# Patient Record
Sex: Female | Born: 1940 | Hispanic: No | Marital: Married | Smoking: Never smoker
Health system: Southern US, Community
[De-identification: ages and names within clinical notes are randomized; demographics above are authoritative.]

## PROBLEM LIST (undated history)

## (undated) DIAGNOSIS — I447 Left bundle-branch block, unspecified: Secondary | ICD-10-CM

## (undated) DIAGNOSIS — I10 Essential (primary) hypertension: Secondary | ICD-10-CM

## (undated) DIAGNOSIS — B019 Varicella without complication: Secondary | ICD-10-CM

## (undated) DIAGNOSIS — E785 Hyperlipidemia, unspecified: Secondary | ICD-10-CM

## (undated) HISTORY — DX: Hyperlipidemia, unspecified: E78.5

## (undated) HISTORY — PX: ABDOMINAL HYSTERECTOMY: SHX81

## (undated) HISTORY — DX: Essential (primary) hypertension: I10

## (undated) HISTORY — DX: Left bundle-branch block, unspecified: I44.7

## (undated) HISTORY — PX: EYE SURGERY: SHX253

## (undated) HISTORY — PX: APPENDECTOMY: SHX54

## (undated) HISTORY — DX: Varicella without complication: B01.9

---

## 1998-03-28 ENCOUNTER — Emergency Department (HOSPITAL_COMMUNITY): Admission: EM | Admit: 1998-03-28 | Discharge: 1998-03-28 | Payer: Self-pay | Admitting: Emergency Medicine

## 2000-01-13 ENCOUNTER — Emergency Department (HOSPITAL_COMMUNITY): Admission: EM | Admit: 2000-01-13 | Discharge: 2000-01-13 | Payer: Self-pay | Admitting: Emergency Medicine

## 2000-06-10 ENCOUNTER — Emergency Department (HOSPITAL_COMMUNITY): Admission: EM | Admit: 2000-06-10 | Discharge: 2000-06-10 | Payer: Self-pay | Admitting: Emergency Medicine

## 2007-05-31 ENCOUNTER — Ambulatory Visit: Payer: Self-pay | Admitting: Internal Medicine

## 2007-05-31 DIAGNOSIS — D485 Neoplasm of uncertain behavior of skin: Secondary | ICD-10-CM

## 2007-05-31 DIAGNOSIS — H409 Unspecified glaucoma: Secondary | ICD-10-CM

## 2007-05-31 DIAGNOSIS — I10 Essential (primary) hypertension: Secondary | ICD-10-CM | POA: Insufficient documentation

## 2007-05-31 DIAGNOSIS — M25569 Pain in unspecified knee: Secondary | ICD-10-CM

## 2007-06-02 ENCOUNTER — Encounter: Payer: Self-pay | Admitting: Internal Medicine

## 2007-06-22 ENCOUNTER — Ambulatory Visit: Payer: Self-pay | Admitting: Internal Medicine

## 2007-06-22 LAB — CONVERTED CEMR LAB
BUN: 12 mg/dL (ref 6–23)
Basophils Relative: 0.2 % (ref 0.0–1.0)
CO2: 31 meq/L (ref 19–32)
Cholesterol: 196 mg/dL (ref 0–200)
GFR calc Af Amer: 80 mL/min
Glucose, Bld: 84 mg/dL (ref 70–99)
HDL: 63.4 mg/dL (ref >39.0)
Hemoglobin: 12.8 g/dL (ref 12.0–15.0)
Lymphocytes Relative: 37.9 % (ref 12.0–46.0)
MCHC: 35.4 g/dL (ref 30.0–36.0)
Monocytes Absolute: 0.4 10*3/uL (ref 0.2–0.7)
Monocytes Relative: 8.7 % (ref 3.0–11.0)
Neutro Abs: 2.2 10*3/uL (ref 1.4–7.7)
Potassium: 3.9 meq/L (ref 3.5–5.1)
VLDL: 9 mg/dL (ref 0–40)

## 2007-06-25 LAB — CONVERTED CEMR LAB: Vit D, 1,25-Dihydroxy: 15 — ABNORMAL LOW (ref 20–57)

## 2007-06-28 ENCOUNTER — Ambulatory Visit: Payer: Self-pay | Admitting: Internal Medicine

## 2007-06-28 DIAGNOSIS — E785 Hyperlipidemia, unspecified: Secondary | ICD-10-CM

## 2007-06-28 DIAGNOSIS — E559 Vitamin D deficiency, unspecified: Secondary | ICD-10-CM | POA: Insufficient documentation

## 2007-06-29 ENCOUNTER — Ambulatory Visit: Payer: Self-pay | Admitting: Internal Medicine

## 2007-08-30 ENCOUNTER — Ambulatory Visit: Payer: Self-pay | Admitting: Internal Medicine

## 2007-11-30 ENCOUNTER — Ambulatory Visit: Payer: Self-pay | Admitting: Internal Medicine

## 2007-12-28 ENCOUNTER — Ambulatory Visit: Payer: Self-pay | Admitting: Internal Medicine

## 2007-12-29 LAB — CONVERTED CEMR LAB
CO2: 29 meq/L (ref 19–32)
Creatinine, Ser: 0.8 mg/dL (ref 0.4–1.2)
Glucose, Bld: 83 mg/dL (ref 70–99)
Potassium: 4.4 meq/L (ref 3.5–5.1)
Sodium: 139 meq/L (ref 135–145)

## 2008-01-25 ENCOUNTER — Ambulatory Visit: Payer: Self-pay | Admitting: Internal Medicine

## 2008-02-08 ENCOUNTER — Telehealth: Payer: Self-pay | Admitting: *Deleted

## 2008-05-17 ENCOUNTER — Ambulatory Visit: Payer: Self-pay | Admitting: Internal Medicine

## 2008-05-24 ENCOUNTER — Ambulatory Visit: Payer: Self-pay | Admitting: Internal Medicine

## 2008-05-30 ENCOUNTER — Telehealth: Payer: Self-pay | Admitting: *Deleted

## 2008-09-25 ENCOUNTER — Ambulatory Visit: Payer: Self-pay | Admitting: Internal Medicine

## 2008-10-03 ENCOUNTER — Ambulatory Visit: Payer: Self-pay | Admitting: Internal Medicine

## 2008-10-11 ENCOUNTER — Telehealth: Payer: Self-pay | Admitting: *Deleted

## 2008-10-20 ENCOUNTER — Telehealth: Payer: Self-pay | Admitting: Internal Medicine

## 2008-11-17 ENCOUNTER — Ambulatory Visit: Payer: Self-pay | Admitting: Internal Medicine

## 2009-06-06 ENCOUNTER — Telehealth: Payer: Self-pay | Admitting: *Deleted

## 2009-08-03 ENCOUNTER — Telehealth (INDEPENDENT_AMBULATORY_CARE_PROVIDER_SITE_OTHER): Payer: Self-pay | Admitting: *Deleted

## 2009-08-28 ENCOUNTER — Ambulatory Visit: Payer: Self-pay | Admitting: Internal Medicine

## 2009-08-28 ENCOUNTER — Telehealth: Payer: Self-pay | Admitting: *Deleted

## 2009-09-03 ENCOUNTER — Telehealth: Payer: Self-pay | Admitting: Internal Medicine

## 2010-07-16 ENCOUNTER — Telehealth: Payer: Self-pay | Admitting: Internal Medicine

## 2010-07-16 ENCOUNTER — Ambulatory Visit: Payer: Self-pay | Admitting: Internal Medicine

## 2010-07-16 DIAGNOSIS — Z9119 Patient's noncompliance with other medical treatment and regimen: Secondary | ICD-10-CM

## 2010-07-16 DIAGNOSIS — R0989 Other specified symptoms and signs involving the circulatory and respiratory systems: Secondary | ICD-10-CM

## 2010-07-16 DIAGNOSIS — I10 Essential (primary) hypertension: Secondary | ICD-10-CM

## 2010-07-16 DIAGNOSIS — I447 Left bundle-branch block, unspecified: Secondary | ICD-10-CM

## 2010-07-18 LAB — CONVERTED CEMR LAB
BUN: 17 mg/dL (ref 6–23)
Bilirubin, Direct: 0.1 mg/dL (ref 0.0–0.3)
CO2: 32 meq/L (ref 19–32)
Chloride: 104 meq/L (ref 96–112)
Cholesterol: 253 mg/dL — ABNORMAL HIGH (ref 0–200)
Creatinine, Ser: 0.9 mg/dL (ref 0.4–1.2)
Eosinophils Absolute: 0.1 10*3/uL (ref 0.0–0.7)
Glucose, Bld: 75 mg/dL (ref 70–99)
Lymphs Abs: 2.6 10*3/uL (ref 0.7–4.0)
MCHC: 33.5 g/dL (ref 30.0–36.0)
MCV: 87.4 fL (ref 78.0–100.0)
Monocytes Absolute: 0.4 10*3/uL (ref 0.1–1.0)
Neutrophils Relative %: 44.2 % (ref 43.0–77.0)
Platelets: 223 10*3/uL (ref 150.0–400.0)
TSH: 3.05 microintl units/mL (ref 0.35–5.50)
Total Bilirubin: 0.7 mg/dL (ref 0.3–1.2)
Total CHOL/HDL Ratio: 3
Total Protein: 7.1 g/dL (ref 6.0–8.3)
Triglycerides: 41 mg/dL (ref 0.0–149.0)
VLDL: 8.2 mg/dL (ref 0.0–40.0)

## 2010-08-05 ENCOUNTER — Ambulatory Visit: Payer: Self-pay | Admitting: Internal Medicine

## 2010-08-09 ENCOUNTER — Encounter: Payer: Self-pay | Admitting: Internal Medicine

## 2010-08-12 ENCOUNTER — Ambulatory Visit: Payer: Self-pay

## 2010-08-12 ENCOUNTER — Ambulatory Visit (HOSPITAL_COMMUNITY): Admission: RE | Admit: 2010-08-12 | Discharge: 2010-08-12 | Payer: Self-pay | Admitting: Internal Medicine

## 2010-08-12 ENCOUNTER — Encounter: Payer: Self-pay | Admitting: Internal Medicine

## 2010-08-12 ENCOUNTER — Ambulatory Visit: Payer: Self-pay | Admitting: Internal Medicine

## 2010-09-03 ENCOUNTER — Ambulatory Visit: Payer: Self-pay | Admitting: Internal Medicine

## 2010-09-06 ENCOUNTER — Ambulatory Visit: Payer: Self-pay | Admitting: Cardiology

## 2010-12-03 NOTE — Progress Notes (Signed)
Summary: Elevated BP  Phone Note From Other Clinic   Caller: Dr. [facility] [employer] Summary of Call: Pt will not be treated today at our office due to repeat elevated BP readings( 208/120 and 191/109).  Pt admits to not taking medication.  Would like to scheduled appt with Dr. Fabian Sharp asap.  Appt scheduled for today at 2:15pm Initial call taken by: Trixie Dredge,  July 16, 2010 11:03 AM  Follow-up for Phone Call        I tried to call pt to see if she could come in soonier and to get more info to see if there is any other problems but number has been disconnected. Pt was last seen in 2009. Follow-up by: Romualdo Bolk, CMA (AAMA),  July 16, 2010 11:17 AM

## 2010-12-03 NOTE — Assessment & Plan Note (Signed)
Summary: 2 WEEK FUP//CCM   Vital Signs:  Patient profile:   70 year old female Menstrual status:  hysterectomy Weight:      132 pounds Pulse rate:   78 / minute BP sitting:   182 / 90  (left arm) Cuff size:   regular  Vitals Entered By: Romualdo Bolk, CMA (AAMA) (August 05, 2010 1:49 PM)  Serial Vital Signs/Assessments:  Time      Position  BP       Pulse  Resp  Temp     By 1:53 PM             180/94                         Romualdo Bolk, CMA (AAMA)           R Arm     170/90                         Madelin Headings MD           L Arm     160/80                         Madelin Headings MD  Comments: 1:53 PM Pt's machine By: Romualdo Bolk, CMA (AAMA)  reg cuff sitting By: Madelin Headings MD    History of Present Illness: Kristina Davidson comes in today  for follow up as instructed for.fu of her  high BP readings . Since last visit she has taken her reading and brings in the machine and readings to review.  She has begun the benicar hctx but only taking 1/2 . her readings from home  are in the 120 130 and some in the 100 range   pulses 70 range . no tachycardia.  no readings recorded over 142 .    No other change in her health.  Denies se of med    Add hx   had positive stool hem in the past and went ot a prelin appt and said that didn have a good interaction so didnt follow though.  no blood in stool and no  concerns there.   Hypertension History:      She denies headache, chest pain, palpitations, dyspnea with exertion, orthopnea, PND, peripheral edema, visual symptoms, neurologic problems, syncope, and side effects from treatment.  She notes no problems with any antihypertensive medication side effects.        Positive major cardiovascular risk factors include female age 27 years old or older, hyperlipidemia, and hypertension.  Negative major cardiovascular risk factors include non-tobacco-user status.     Preventive Screening-Counseling &  Management  Alcohol-Tobacco     Alcohol drinks/day: 0     Smoking Status: never  Caffeine-Diet-Exercise     Caffeine use/day: 0     Does Patient Exercise: no     Type of exercise: aerobic     Times/week: 7  Current Medications (verified): 1)  Fish Oil 1000 Mg  Caps (Omega-3 Fatty Acids) 2)  Travatan Z 0.004 %  Soln (Travoprost) 3)  Systane 0.4-0.3 %  Soln (Polyethyl Glycol-Propyl Glycol) 4)  Vitamin D 1000 Unit Tabs (Cholecalciferol) 5)  Magnesium 300 Mg Caps (Magnesium) 6)  Benicar Hct 40-25 Mg Tabs (Olmesartan Medoxomil-Hctz) .Marland Kitchen.. 1 By Mouth Once Daily For High Blood Pressure  Allergies (verified): 1)  ! Cosopt (Dorzolamide  Hcl-Timolol Mal) 2)  ! Alphagan P (Brimonidine Tartrate) 3)  ! Sudafed 4)  ! Lisinopril (Lisinopril)  Past History:  Past medical, surgical, family and social histories (including risk factors) reviewed for relevance to current acute and chronic problems.  Past Medical History: Reviewed history from 07/16/2010 and no changes required. 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 Cataract extraction Hysterectomy 1975  Past History:  Care Management: Primary Care Md- In [place] of Name [facility]  Family History: Reviewed history from 07/16/2010 and no changes required. Family History of CAD Female 1st degree relative <50  bro father Family History Hypertension Family History Kidney disease See data base  Grand daughter has charge syndrome.  and has had   Social History: Reviewed history from 07/16/2010 and no changes required. Retired [employer] 3  Married Never Smoked Alcohol use-no Regular exercise-no  see data base grandchild with special needs.     Review of Systems  The patient denies anorexia, fever, chest pain, syncope, dyspnea on exertion, abdominal pain, melena, and hematochezia.          hx of leg pain.   not now   had pop in the summer now better.   to have eye surgery [facility] in the fall.   Physical Exam  General:  Well-developed,well-nourished,in no acute distress; alert,appropriate and cooperative throughout examination Head:  normocephalic.   Lungs:  Normal respiratory effort, chest expands symmetrically. Lungs are clear to auscultation, no crackles or wheezes. Heart:  Normal rate and regular rhythm. S1 and S2 normal without gallop, murmur, click, rub or other extra sounds. see readings  Pulses:  pulses intact without delay   Extremities:  no clubbing cyanosis or edema  Neurologic:  alert & oriented X3, strength normal in all extremities, and gait normal.   Skin:  turgor normal, color normal, no ecchymoses, and no petechiae.   Cervical Nodes:  No lymphadenopathy noted Psych:  Oriented X3, normally interactive, good eye contact, not depressed appearing, and slightly anxious.   see labs  reviewed   Impression & Recommendations:  Problem # 1:  HYPERTENSION, SEVERE (ICD-401.0)  readings at home are very good    and readings with her machine her are  very up in office but decreases  with time   . Definietly has a white coat component   continue 1/2 pill for now as if accurate concern about  low bp at home.    Looks well and has no   cv signs  but of course her ekg is abnormal ( see lst visit) Her updated medication list for this problem includes:    Benicar Hct 40-25 Mg Tabs (Olmesartan medoxomil-hctz) .Marland Kitchen... 1 by mouth once daily for high blood pressure  BP today: 182/90 Prior BP: 198/100 (07/16/2010)  Prior 10 Yr Risk Heart Disease: 11 % (07/16/2010)  Labs Reviewed: K+: 3.7 (07/16/2010) Creat: : 0.9 (07/16/2010)   Chol: 253 (07/16/2010)   HDL: 85.60 (07/16/2010)   LDL: 123 (06/22/2007)   TG: 41.0 (07/16/2010)  Problem # 2:  CAROTID BRUIT, RIGHT (ICD-785.9) pending doppler   Problem # 3:  LEFT BUNDLE BRANCH BLOCK (ICD-426.3) echo pending   then will plan   follow up and  cards eval.    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   Complete Medication List: 1)  Fish Oil 1000 Mg Caps (Omega-3 fatty acids) 2)  Travatan Z 0.004 % Soln (Travoprost) 3)  Systane 0.4-0.3 % Soln (Polyethyl glycol-propyl glycol) 4)  Vitamin D 1000 Unit Tabs (Cholecalciferol) 5)  Magnesium 300 Mg Caps (Magnesium) 6)  Benicar Hct 40-25 Mg Tabs (Olmesartan medoxomil-hctz) .Marland Kitchen.. 1 by mouth once daily for high blood pressure  Other Orders: Admin 1st Vaccine (09811) Flu Vaccine 56yrs + (91478)  Hypertension Assessment/Plan:      The patient's hypertensive risk group is category B: At least one risk factor (excluding diabetes) with no target organ damage.  Her calculated 10 year risk of coronary heart disease is 11 %.  Today's blood pressure is 182/90.  Her blood pressure goal is < 140/90.  Patient Instructions: 1)  continue  1/2 dose  of the benicar hctz daily. 2)  continue to monitor your blood pressure readings as you are doing.  3)  return office visit in 3-4 weeks (call if readings are  very elevated .  4)  Keep appt  for the echo and neck carotid dopplers .   5)  we may do a cardiology referral after this to get  more evaluation of your EKG .           Flu Vaccine Consent Questions     Do you have a history of severe allergic reactions to this vaccine? no    Any prior history of allergic reactions to egg and/or gelatin? no    Do you have a sensitivity to the preservative Thimersol? no    Do you have a past history of Guillan-Barre Syndrome? no    Do you currently have an acute febrile illness? no    Have you ever had a severe reaction to latex? no    Vaccine information given and explained to patient? yes    Are you currently pregnant? no    Lot Number:[id number]   Exp Date:05/03/2011   Site  Given  Left Deltoid IMbflu Romualdo Bolk, CMA (AAMA)  August 05, 2010 1:59 PM

## 2010-12-03 NOTE — Assessment & Plan Note (Signed)
Summary: np6/mild muscle stain on echo/lg  Medications Added FISH OIL 1000 MG  CAPS (OMEGA-3 FATTY ACIDS) 1 tab by mouth once daily TRAVATAN Z 0.004 %  SOLN (TRAVOPROST) as directed SYSTANE 0.4-0.3 %  SOLN (POLYETHYL GLYCOL-PROPYL GLYCOL) as directed VITAMIN D3 5000 UNIT CAPS (CHOLECALCIFEROL) 1 tab by mouth once daily MAGNESIUM 250 MG TABS (MAGNESIUM) 1 tab by mouth once daily        History of Present Illness: 70 year old female for evaluation of left bundle branch block and mild cardiomyopathy. Echocardiogram in October of 2011 showed an ejection fraction of 45% and mild left ventricular hypertrophy. There was trivial mitral regurgitation. Carotid Dopplers in October of 2011 showed a 40-59% left and 0-39% right stenosis. Followup recommended in 2 years. Note TSH, potassium, renal function and hemoglobin recently normal. Because of the above we were asked to further evaluate. The patient denies dyspnea on exertion, orthopnea, PND, pedal edema, palpitations, syncope or chest pain.  Current Medications (verified): 1)  Fish Oil 1000 Mg  Caps (Omega-3 Fatty Acids) .Marland Kitchen.. 1 Tab By Mouth Once Daily 2)  Travatan Z 0.004 %  Soln (Travoprost) .... As Directed 3)  Systane 0.4-0.3 %  Soln (Polyethyl Glycol-Propyl Glycol) .... As Directed 4)  Benicar Hct 20-12.5 Mg Tabs (Olmesartan Medoxomil-Hctz) .Marland Kitchen.. 1 By Mouth Once Daily  For Hypertension 5)  Vitamin D3 5000 Unit Caps (Cholecalciferol) .Marland Kitchen.. 1 Tab By Mouth Once Daily 6)  Magnesium 250 Mg Tabs (Magnesium) .Marland Kitchen.. 1 Tab By Mouth Once Daily  Allergies: 1)  ! Cosopt (Dorzolamide Hcl-Timolol Mal) 2)  ! Alphagan P (Brimonidine Tartrate) 3)  ! Sudafed 4)  ! Lisinopril (Lisinopril)  Past History:  Past Medical History: HYPERLIPIDEMIA LEFT BUNDLE BRANCH BLOCK CAROTID ARTERY DISEASE HYPERTENSION  Cardiomyopathy HEMOCCULT POSITIVE STOOL IFO DEFICIENCY, VITAMIN D NOS  Glaucoma  dec vision left eye childbirth x 3   g3 p3  Varicella  Past Surgical  History: Hysterectomy 1975 Appendectomy  Family History: Reviewed history from 07/16/2010 and no changes required. Family History of CAD Female 1st degree relative <50 father Family History Hypertension Family History Kidney disease  Social History: Reviewed history from 07/16/2010 and no changes required. Retired [employer] 3  Married Never Smoked Alcohol use-no Regular exercise-no  grandchild with special needs.     Review of Systems       Some back pain but no fevers or chills, productive cough, hemoptysis, dysphasia, odynophagia, melena, hematochezia, dysuria, hematuria, rash, seizure activity, orthopnea, PND, pedal edema, claudication. Remaining systems are negative.   Vital Signs:  Patient profile:   70 year old female Menstrual status:  hysterectomy Height:      62 inches Weight:      133 pounds BMI:     24.41 Pulse rate:   70 / minute Resp:     14 per minute BP sitting:   130 / 90  (left arm)  Vitals Entered By: Kem Parkinson (September 06, 2010 12:10 PM)  Physical Exam  General:  Well developed/well nourished in NAD Skin warm/dry Patient not depressed No peripheral clubbing Back-normal HEENT-normal/normal eyelids Neck supple/normal carotid upstroke bilaterally; bilateral bruits right greater than left; no JVD; no thyromegaly chest - CTA/ normal expansion CV - RRR/normal S1 and S2; no murmurs, rubs or gallops;  PMI nondisplaced Abdomen -NT/ND, no HSM, no mass, + bowel sounds, no bruit 2+ femoral pulses, no bruits Ext-no edema, chords, 2+ DP Neuro-grossly nonfocal     EKG  Procedure date:  07/16/2010  Findings:  Sinus rhythm at a rate of 74. Left bundle branch block.  Impression & Recommendations:  Problem # 1:  HYPERTENSION (ICD-401.9) Her blood pressure is mildly elevated today. However she checks this routinely at home and atypically is normal. Her systolic is less than 120 and her diastolic less than 80. Continue present medications.  Potassium and renal function monitored by primary care. Her updated medication list for this problem includes:    Benicar Hct 20-12.5 Mg Tabs (Olmesartan medoxomil-hctz) .Marland Kitchen... 1 by mouth once daily  for hypertension  Problem # 2:  HYPERLIPIDEMIA NEC/NOS (ICD-272.4) LDL is 159. She has documented cerebrovascular disease. I recommended a statin. However she is hesitant. She would like to discuss this with Dr. Fabian Sharp first.  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 I recommended a statin as described above. I also recommended enteric-coated aspirin 81 mg p.o. daily. However she apparently had a bleed in her eye previously. She would like to discuss this with her ophthalmologist prior to initiating. If no contraindication she should begin aspirin. Followup carotid Dopplers October 2013. I will leave this to primary care.  Problem # 4:  LEFT BUNDLE BRANCH BLOCK (ICD-426.3)  Problem # 5:  LEFT VENTRICULAR FUNCTION, DECREASED ECHO 2011 (ICD-429.2) Mildly reduced LV function on echocardiogram. Note TSH normal. I recommended a Lexiscan Myoview to exclude coronary disease. The patient is concerned about the cost and is not agreeable at present. She will consider this and contact us if she wishes to proceed. Hypertension may be the cause of her mildly reduced LV function. Continue present medications.

## 2010-12-03 NOTE — Assessment & Plan Note (Signed)
Summary: ELEVATED BP READINGS PER DR. DOWNEY//SLM   Vital Signs:  Patient profile:   70 year old female Height:      62.5 inches Weight:      132 pounds BMI:     23.84 Pulse rate:   60 / minute BP sitting:   198 / 100  (right arm) Cuff size:   regular  Vitals Entered By: Romualdo Bolk, CMA (AAMA) (July 16, 2010 12:56 PM)  Serial Vital Signs/Assessments:  Time      Position  BP       Pulse  Resp  Temp     By 2:28 PM             190/90                         Romualdo Bolk, CMA (AAMA) 3:03 PM             150/90                         Madelin Headings MD  Comments: 2:28 PM Left arm By: Romualdo Bolk, CMA (AAMA)  3:03 PM left arm sitting By: Madelin Headings MD   CC: BP elevated at the dentist BP was 208/120 then 191/109, pt is having slight chest pains coming over here.- Last seen by a primary care md over 1 year ago- in [place], [place]- Pt states that she was put on lisinopril but she had a cough with it. She has been off this for over 1 year. , Hypertension Management   History of Present Illness: Kristina Davidson comes in today as anemergency work in and for above. Sent  by dental school with BP elevations  She has not been in this office  for 2 years   cancelled her appts with [place] and ended up stopping her blood pressure medications. Gi  had appt f for colonscopy a nd eval for positive stools   test for blood.   Now dental school calling  for her to be seen. today  answer blood pressure there was 208 of her hundred and 28 and 191 over hundred and nine she will not be allowed to come back for treatment until her blood pressure is  below160/100  She apparently not taking any meds for Bp   . Lisinopril  caused a severe cough    no swelling in legs   .    no sob or sig c p .     has had some HA new changing her vision is under treatment for glaucoma.  Denies any weakness or numbness.   Hypertension History:      She complains of headache and chest pain, but denies  palpitations, dyspnea with exertion, orthopnea, PND, peripheral edema, visual symptoms, neurologic problems, syncope, and side effects from treatment.  She notes no problems with any antihypertensive medication side effects.  Slight ha's and Slight Chest Pains.        Positive major cardiovascular risk factors include female age 17 years old or older, hyperlipidemia, and hypertension.  Negative major cardiovascular risk factors include non-tobacco-user status.     Preventive Screening-Counseling & Management  Alcohol-Tobacco     Alcohol drinks/day: 0     Smoking Status: never  Caffeine-Diet-Exercise     Caffeine use/day: 0     Does Patient Exercise: no  Hep-HIV-STD-Contraception     Dental Visit-last 6 months yes  Safety-Violence-Falls     Seat Belt Use: yes     Smoke Detectors: yes      Blood Transfusions:  yes.    Current Medications (verified): 1)  Fish Oil 1000 Mg  Caps (Omega-3 Fatty Acids) 2)  Travatan Z 0.004 %  Soln (Travoprost) 3)  Systane 0.4-0.3 %  Soln (Polyethyl Glycol-Propyl Glycol) 4)  Vitamin D 1000 Unit Tabs (Cholecalciferol) 5)  Magnesium 300 Mg Caps (Magnesium)  Allergies (verified): 1)  ! Cosopt (Dorzolamide Hcl-Timolol Mal) 2)  ! Alphagan P (Brimonidine Tartrate) 3)  ! Sudafed 4)  ! Lisinopril (Lisinopril)  Past History:  Past Medical History: Hypertension Glaucoma  dec vision left eye childbirth x 3   g3 p3  Blood transfusion Varicella  Past Surgical History: Cataract extraction Hysterectomy 1975  Past History:  Care Management: Primary Care Md- In [place]- Unsure of Name [facility]  Family History: Family History of CAD Female 1st degree relative <50  bro father Family History Hypertension Family History Kidney disease See data base  Grand daughter has charge syndrome.  and has had   Social History: Retired [employer] 3  Married Never Smoked Alcohol use-no Regular exercise-no  see data base grandchild with special needs.      Caffeine use/day:  0 Seat Belt Use:  yes Dental Care w/in 6 mos.:  yes Blood Transfusions:  yes  Review of Systems  The patient denies anorexia, fever, weight loss, weight gain, decreased hearing, hoarseness, syncope, dyspnea on exertion, prolonged cough, hemoptysis, abdominal pain, melena, hematochezia, severe indigestion/heartburn, hematuria, muscle weakness, transient blindness, difficulty walking, depression, unusual weight change, abnormal bleeding, enlarged lymph nodes, and angioedema.    Physical Exam  General:  Well-developed,well-nourished,in no acute distress; alert,appropriate and cooperative throughout examination mildly anxious Head:  normocephalic and atraumatic.   Eyes:  vision grossly intact.  some redness around no discharge  Ears:  R ear normal, L ear normal, and no external deformities.   Nose:  no external deformity, no external erythema, and no nasal discharge.   Mouth:  pharynx pink and moist.   Neck:  bruit  right   neck   no masses  supple  Lungs:  Normal respiratory effort, chest expands symmetrically. Lungs are clear to auscultation, no crackles or wheezes.no dullness.   Heart:  Normal rate and regular rhythm. S1 and S2 normal without gallop, murmur, click, rub or other extra sounds.no lifts.   Abdomen:  Bowel sounds positive,abdomen soft and non-tender without masses, organomegaly or hernias noted. no bruits  no masses  Msk:  no joint swelling and no joint warmth.   Pulses:  pulses intact without delay   Extremities:  trace left pedal edema and trace right pedal edema.   Neurologic:  alert & oriented X3, strength normal in all extremities, and gait normal.  nl  non focal  Skin:  turgor normal, color normal, no ecchymoses, and no petechiae.   Cervical Nodes:  No lymphadenopathy noted Psych:  Oriented X3, not depressed appearing, and slightly anxious.  quiet and   nnl cognition EKG  rate 74  LBBB none to compare with    Impression & Recommendations:  Problem  # 1:  HYPERTENSION, SEVERE (ICD-401.0) apparently untreated for  at least 6-8 months by hx .  patient says readings were good enough in Feb to see dentist and had 130 type reading in the last month...     patient was observed in the office after being given .1 mg clonidine. She tolerated it well  and after two hours was able to leave the office without significant symptoms. her discharge blood pressure was about 150. The following medications were removed from the medication list:    Maxzide-25 37.5-25 Mg Tabs (Triamterene-hctz) .Marland Kitchen... 1/2 - 1 by mouth once daily for high blood pressure Her updated medication list for this problem includes:    Benicar Hct 40-25 Mg Tabs (Olmesartan medoxomil-hctz) .Marland Kitchen... 1 by mouth once daily for high blood pressure  Orders: TLB-BMP (Basic Metabolic Panel-BMET) (80048-METABOL) TLB-CBC Platelet - w/Differential (85025-CBCD) TLB-Hepatic/Liver Function Pnl (80076-HEPATIC) TLB-TSH (Thyroid Stimulating Hormone) (84443-TSH) TLB-Lipid Panel (80061-LIPID) TLB-Sedimentation Rate (ESR) (85652-ESR) Clonidine 0.1mg  tab (EMRORAL) EKG w/ Interpretation (93000)  Problem # 2:  NON ADHERENCE  TO MEDICATION (ICD-V15.81) lack of medical care for this and failure to follow up  related to family  obligations and inability to get to help  care of  special needs grand child in [place].   Problem # 3:  LEFT BUNDLE BRANCH BLOCK (ICD-426.3) unclear how new this finding is .     no hx of cv neuro events  .  nwill need echo and  cards eval    eventually   Orders: Cardiology Referral (Cardiology) EKG w/ Interpretation (93000)  Problem # 4:  GLUCOMA (ICD-365.9) [facility] at [facility] .  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 She apparently  has no symptoms of blood in her stool.  Complete Medication List: 1)  Fish Oil 1000 Mg Caps (Omega-3 fatty acids) 2)  Travatan Z 0.004 % Soln (Travoprost) 3)  Systane 0.4-0.3 % Soln (Polyethyl glycol-propyl glycol) 4)  Vitamin D 1000 Unit Tabs (Cholecalciferol) 5)  Magnesium 300 Mg Caps (Magnesium) 6)  Benicar Hct 40-25 Mg Tabs (Olmesartan medoxomil-hctz) .Marland Kitchen.. 1 by mouth once daily for high blood pressure  Hypertension Assessment/Plan:      The patient's hypertensive risk group is category B: At least one risk factor (excluding diabetes) with no target organ damage.  Her calculated 10 year risk of coronary heart disease is 11 %.  Today's blood pressure is 198/100.  Her blood pressure goal is < 140/90.  Patient Instructions: 1)  take it easy  this week until Bp is down  to below 160 /100 2)  begin new BP med samples  cn take 1/2 of pill   each day  to begin if you wish. 3)  we gave you  .1 mg  of clonidine in the office .  4)  Rreturn office visit in 2 weeks    5)  bring your BP machine to ov with you. 6)  We should order an echo test of your heart because of the abnormal EKG and   doppler test of your neck arteries   then  further evaluation  pending these results.    Medication Administration  Medication # 1:    Medication: Clonidine 0.1mg  tab    Diagnosis: HYPERTENSION, SEVERE (ICD-401.0)    Dose: 1 tablet    Route: po    Exp Date: 02/02/2011    Lot #: [id number]    Mfr: [employer]    Comments: Gave at 2pm    Patient tolerated medication without complications    Given by: Romualdo Bolk, CMA Duncan Dull) (July 16, 2010 2:05  PM)  Orders Added: 1)  TLB-BMP (Basic Metabolic Panel-BMET) [80048-METABOL] 2)  TLB-CBC Platelet - w/Differential [85025-CBCD] 3)  TLB-Hepatic/Liver Function Pnl [80076-HEPATIC] 4)  TLB-TSH (Thyroid Stimulating Hormone) [84443-TSH] 5)  TLB-Lipid Panel [80061-LIPID] 6)  TLB-Sedimentation Rate (ESR) [85652-ESR] 7)  Clonidine 0.1mg  tab [EMRORAL] 8)   Cardiology Referral [Cardiology] 9)  Cardiology Referral [Cardiology] 10)  Est. Patient Level V [62130] 11)  EKG w/ Interpretation [93000]

## 2010-12-03 NOTE — Assessment & Plan Note (Signed)
Summary: 4 week fup//ccm   Vital Signs:  Patient profile:   70 year old female Menstrual status:  hysterectomy Weight:      133 pounds Pulse rate:   66 / minute BP sitting:   120 / 80  (left arm) Cuff size:   regular  Vitals Entered By: Romualdo Bolk, CMA (AAMA) (September 03, 2010 10:39 AM)  Serial Vital Signs/Assessments:  Time      Position  BP       Pulse  Resp  Temp     By                     140/80                         Madelin Headings MD  Comments: right arm sitting  By: Madelin Headings MD   CC: follow-up visit, Hypertension Management   History of Present Illness: Kristina Davidson comes in today  for follow up with her daughter related to elevated Bp abnormal EKG LBBB and caritid bruit. Since last visit  here  there have been no major changes in health status   feels well and no cp sob  cough or sig edema.  Has taken bp readingsa t home and has a log that shows all 120 range or less with pulse of 70-80.  Daughter  says she helped take the readings  Hypertension History:      She denies headache, chest pain, palpitations, dyspnea with exertion, orthopnea, PND, peripheral edema, visual symptoms, neurologic problems, syncope, and side effects from treatment.  She notes no problems with any antihypertensive medication side effects.        Positive major cardiovascular risk factors include female age 61 years old or older, hyperlipidemia, and hypertension.  Negative major cardiovascular risk factors include non-tobacco-user status.     Preventive Screening-Counseling & Management  Alcohol-Tobacco     Alcohol drinks/day: 0     Smoking Status: never  Caffeine-Diet-Exercise     Caffeine use/day: 0     Does Patient Exercise: no     Type of exercise: aerobic     Times/week: 7  Current Medications (verified): 1)  Fish Oil 1000 Mg  Caps (Omega-3 Fatty Acids) 2)  Travatan Z 0.004 %  Soln (Travoprost) 3)  Systane 0.4-0.3 %  Soln (Polyethyl Glycol-Propyl Glycol) 4)   Vitamin D 1000 Unit Tabs (Cholecalciferol) 5)  Magnesium 300 Mg Caps (Magnesium) 6)  Benicar Hct 40-25 Mg Tabs (Olmesartan Medoxomil-Hctz) .Marland Kitchen.. 1 By Mouth Once Daily For High Blood Pressure  Allergies (verified): 1)  ! Cosopt (Dorzolamide Hcl-Timolol Mal) 2)  ! Alphagan P (Brimonidine Tartrate) 3)  ! Sudafed 4)  ! Lisinopril (Lisinopril)  Past History:  Past medical, surgical, family and social histories (including risk factors) reviewed, and no changes noted (except as noted below).  Past Medical History: Reviewed history from 07/16/2010 and no changes required. 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 Cataract extraction Hysterectomy 1975  Past History:  Care Management: Primary Care Md- [place] [facility] Name [facility]  Family History: Reviewed history from 07/16/2010 and no changes required. Family History of CAD Female 1st degree relative <50  bro father Family History Hypertension Family History Kidney disease See data base  Grand daughter has charge syndrome.  and has had   Social History: Reviewed history from 07/16/2010 and no changes required. Retired [employer] 3  Married Never Smoked Alcohol use-no Regular exercise-no  see data base grandchild with special needs.     Review of Systems  The patient denies anorexia, fever, weight loss, weight gain, chest pain, syncope, dyspnea on exertion, prolonged cough, abdominal pain, difficulty walking, abnormal bleeding, enlarged lymph nodes, and angioedema.    Physical Exam  General:  Well-developed,well-nourished,in no acute distress; alert,appropriate and cooperative throughout examination Head:  normocephalic and atraumatic.   Heart:  normal rate and regular rhythm.   Psych:  Oriented X3, good eye contact, not anxious appearing, and not depressed appearing.   reviewed     BP readings  log  all 130 or below   and pulse 70 range   Reviewed  carotid dopplers and echo cardiogram.    Impression & Recommendations:  Problem # 1:  HYPERTENSION, SEVERE (ICD-401.0) Assessment Improved nl at home and white coat effect also   ok to stay on 20/12.5 of benicar hctz and keep appt.  eventually needs repeat Chem panel     to recheck potassium and cr on meds .  The following medications were removed from the medication list:    Benicar Hct 40-25 Mg Tabs (Olmesartan medoxomil-hctz) .Marland Kitchen... 1 by mouth once daily for high blood pressure Her updated medication list for this problem includes:    Benicar Hct 20-12.5 Mg Tabs (Olmesartan medoxomil-hctz) .Marland Kitchen... 1 by mouth once daily  for hypertension  Problem # 2:  LEFT BUNDLE BRANCH BLOCK (ICD-426.3) mild hypokineses on  echo  mild lvh   apparently no   symptoms .    Problem # 3:  CAROTID BRUIT, RIGHT (ICD-785.9) dopplers show moderate obst on left   follow  Problem # 4:  GLUCOMA (ICD-365.9) eye surgery pending  in December .  get cards eval in the meantime  Problem # 5:  LEFT VENTRICULAR FUNCTION, DECREASED ECHO 2011 (ICD-429.2)  The following medications were removed from the medication list:    Benicar Hct 40-25 Mg Tabs (Olmesartan medoxomil-hctz) .Marland Kitchen... 1 by mouth once daily for high blood pressure Her updated medication list for this problem includes:    Benicar Hct 20-12.5 Mg Tabs (Olmesartan medoxomil-hctz) .Marland Kitchen... 1 by mouth once daily  for hypertension  Complete Medication List: 1)  Fish Oil 1000 Mg Caps (Omega-3 fatty acids) 2)  Travatan Z 0.004 % Soln (Travoprost) 3)  Systane 0.4-0.3 % Soln (Polyethyl glycol-propyl glycol) 4)  Vitamin D 1000 Unit Tabs (Cholecalciferol) 5)  Magnesium 300 Mg Caps (Magnesium) 6)  Benicar Hct 20-12.5 Mg Tabs (Olmesartan medoxomil-hctz) .Marland Kitchen.. 1 by mouth once daily  for hypertension  Hypertension Assessment/Plan:      The patient's hypertensive risk group is category B: At least one risk  factor (excluding diabetes) with no target organ damage.  Her calculated 10 year risk of coronary heart disease is 7 %.  Today's blood pressure is 120/80.  Her blood pressure goal is < 140/90.  Patient Instructions: 1)  continue same Blood pressure medication and keep cardiology appt. 2)  Then plan follow up after cardiology evaluation   .  Prescriptions: BENICAR HCT 20-12.5 MG TABS (OLMESARTAN MEDOXOMIL-HCTZ) 1 by mouth once daily  for hypertension  #30 x 5   Entered and Authorized by:   Madelin Headings MD   Signed by:   Madelin Headings MD on 09/03/2010   Method used:   Electronically to  [facility] [address] [phone]* (retail)       [address] [place], [place]  [address]       Ph: [phone]       Fax: [phone]   RxID:   [id number]    Orders Added: 1)  Est. Patient Level IV [[address]]

## 2010-12-03 NOTE — Miscellaneous (Signed)
Summary: Orders Update  Clinical Lists Changes  Orders: Added new Test order of Carotid Duplex (Carotid Duplex) - Signed 

## 2011-06-02 ENCOUNTER — Encounter: Payer: Self-pay | Admitting: Internal Medicine

## 2011-06-02 ENCOUNTER — Other Ambulatory Visit: Payer: Self-pay | Admitting: Internal Medicine

## 2011-06-03 ENCOUNTER — Ambulatory Visit (INDEPENDENT_AMBULATORY_CARE_PROVIDER_SITE_OTHER): Payer: Medicare Other | Admitting: Internal Medicine

## 2011-06-03 ENCOUNTER — Encounter: Payer: Self-pay | Admitting: Internal Medicine

## 2011-06-03 VITALS — BP 120/90 | HR 72 | Wt 134.0 lb

## 2011-06-03 DIAGNOSIS — I10 Essential (primary) hypertension: Secondary | ICD-10-CM

## 2011-06-03 DIAGNOSIS — Z91199 Patient's noncompliance with other medical treatment and regimen due to unspecified reason: Secondary | ICD-10-CM

## 2011-06-03 DIAGNOSIS — R0989 Other specified symptoms and signs involving the circulatory and respiratory systems: Secondary | ICD-10-CM

## 2011-06-03 DIAGNOSIS — E785 Hyperlipidemia, unspecified: Secondary | ICD-10-CM

## 2011-06-03 DIAGNOSIS — I428 Other cardiomyopathies: Secondary | ICD-10-CM

## 2011-06-03 DIAGNOSIS — Z9119 Patient's noncompliance with other medical treatment and regimen: Secondary | ICD-10-CM

## 2011-06-03 DIAGNOSIS — I447 Left bundle-branch block, unspecified: Secondary | ICD-10-CM

## 2011-06-03 DIAGNOSIS — H409 Unspecified glaucoma: Secondary | ICD-10-CM

## 2011-06-03 MED ORDER — SIMVASTATIN 20 MG PO TABS: 20.0000 mg | ORAL_TABLET | Freq: Every day | ORAL | Status: DC

## 2011-06-03 NOTE — Progress Notes (Signed)
  Subjective:    Patient ID: Kristina Davidson, female    DOB: [birth year], 70 y.o.   MRN: [id number]  HPI Patient comes in for followup visit. Her last visit with [place] was in the fall of 2011. After she saw cardiology consult and she was supposed to come back but this didn't happen. Cardiology had recommended a statin medicine daily aspirin if it was okay with her eye doctor and good blood pressure control followup carotid Dopplers in 2013.  Since her last visit 9 months ago she did have eye surgery  December and late February . Cataract and replacement lens.    Vision is better  However recentlyhusband had stroke    causing stress and disruption    Daughter and husband and herself and sometime help some.   Blood pressure :  On meds  And not checking.  But seems to be ok no se of meds no recent labs  Review of Systems Neg cp sob edema cough numbness or bleeding   Past history family history social history reviewed in the electronic medical record.       Objective:   Physical Exam WDWN in nad  Neck suplle no masses  High pitched bruit right low faint on left  Chest:  Clear to A&P without wheezes rales or rhonchi CV:  S1-S2 no gallops or murmurs peripheral perfusion is normal  Repeat BP 145/90 right arm No clubbing cyanosis  1 + edema  neuro grossly  Intact  Anxious but nl affect and cognition and balance      Assessment & Plan:  Hypertension   ? Control thinks its good  My reading much higher than cma  Carotid artery disease  Right   Due for doppler in 2013 Dyslipidemia  Disc  use of statins rec by cards and reasoning . Cost  And se are  Issues    Counseled.  Hesitant to use meds but will try  Glaucoma LBBB with some mild cm  lack of timely follow up but doing ok. Family illness Total visit 40 mins > 50% spent counseling and coordinating care

## 2011-06-03 NOTE — Patient Instructions (Signed)
Check BP readings at home to make sure your Blood pressure is controlled.  Begin statin cholesterol medication as we discussed. Labs in 6-8 weeks  To include lipids and kidney function.  Then plan follow up after this is done.

## 2011-06-07 ENCOUNTER — Encounter: Payer: Self-pay | Admitting: Internal Medicine

## 2011-06-07 DIAGNOSIS — I429 Cardiomyopathy, unspecified: Secondary | ICD-10-CM | POA: Insufficient documentation

## 2011-07-09 ENCOUNTER — Other Ambulatory Visit: Payer: Self-pay | Admitting: Internal Medicine

## 2011-07-29 ENCOUNTER — Other Ambulatory Visit (INDEPENDENT_AMBULATORY_CARE_PROVIDER_SITE_OTHER): Payer: Medicare Other

## 2011-07-29 DIAGNOSIS — I10 Essential (primary) hypertension: Secondary | ICD-10-CM

## 2011-07-29 DIAGNOSIS — E785 Hyperlipidemia, unspecified: Secondary | ICD-10-CM

## 2011-07-29 LAB — LIPID PANEL
Cholesterol: 208 mg/dL — ABNORMAL HIGH (ref 0–200)
Total CHOL/HDL Ratio: 3
VLDL: 7.2 mg/dL (ref 0.0–40.0)

## 2011-07-29 LAB — BASIC METABOLIC PANEL
Chloride: 103 mEq/L (ref 96–112)
Creatinine, Ser: 1.2 mg/dL (ref 0.4–1.2)
Sodium: 140 mEq/L (ref 135–145)

## 2011-07-29 LAB — LDL CHOLESTEROL, DIRECT: Direct LDL: 116.3 mg/dL

## 2011-07-29 LAB — HEPATIC FUNCTION PANEL
ALT: 13 U/L (ref 0–35)
Alkaline Phosphatase: 37 U/L — ABNORMAL LOW (ref 39–117)
Bilirubin, Direct: 0.1 mg/dL (ref 0.0–0.3)
Total Bilirubin: 0.7 mg/dL (ref 0.3–1.2)

## 2011-08-25 ENCOUNTER — Encounter: Payer: Self-pay | Admitting: Internal Medicine

## 2011-08-25 ENCOUNTER — Ambulatory Visit (INDEPENDENT_AMBULATORY_CARE_PROVIDER_SITE_OTHER): Payer: Medicare Other | Admitting: Internal Medicine

## 2011-08-25 VITALS — BP 122/88 | HR 60 | Temp 98.4°F | Wt 130.0 lb

## 2011-08-25 DIAGNOSIS — Z23 Encounter for immunization: Secondary | ICD-10-CM

## 2011-08-25 DIAGNOSIS — I10 Essential (primary) hypertension: Secondary | ICD-10-CM

## 2011-08-25 DIAGNOSIS — E785 Hyperlipidemia, unspecified: Secondary | ICD-10-CM

## 2011-08-25 DIAGNOSIS — I428 Other cardiomyopathies: Secondary | ICD-10-CM

## 2011-08-25 DIAGNOSIS — H409 Unspecified glaucoma: Secondary | ICD-10-CM

## 2011-08-25 DIAGNOSIS — I779 Disorder of arteries and arterioles, unspecified: Secondary | ICD-10-CM

## 2011-08-25 DIAGNOSIS — I447 Left bundle-branch block, unspecified: Secondary | ICD-10-CM

## 2011-08-25 NOTE — Progress Notes (Signed)
Subjective:    Patient ID: Kristina Davidson, female    DOB: April 16, 1941, 70 y.o.   MRN: [id number]  HPI Patient comes in today for follow up of  multiple medical problems.   No major change in health status since last visit .   taking meds no se noted  bp goes up with stress but thinks it has been ok   CM lbbb no cp sob edema  Doe  LIPIDs  Not taking med causing worried  About se .    Carotid disease no strokes or tia  Sx  taking asa    Review of Systems ROS:  GEN/ HEENTNo fever, significant weight changes sweats headaches  hearing changes, CV/ PULM; No chest pain shortness of breath cough, syncope,edema  change in exercise tolerance. GI /GU: No adominal pain, vomiting, change in bowel habits. No blood in the stool. No significant GU symptoms. SKIN/HEME: ,no acute skin rashes suspicious lesions or bleeding. No lymphadenopathy, nodules, masses.  NEURO/ PSYCH:  No neurologic signs such as weakness numbness . IMM/ Allergy: No unusual infections.  Marland Kitchen   REST of 12 system review negative or as per hpii  Past Medical History  Diagnosis Date  . Hyperlipidemia   . Left bundle branch block   . Hypertension   . Cardiomyopathy   . Vitamin D deficiency   . Glaucoma     dev vision left eye  . Varicella    Past Surgical History  Procedure Date  . Abdominal hysterectomy   . Appendectomy   . Eye surgery     glaucoma.cataract    reports that she has never smoked. She does not have any smokeless tobacco history on file. She reports that she does not drink alcohol or use illicit drugs. family history includes Coronary artery disease in her other; Heart disease in her father; Hypertension in her other; and Kidney disease in her other. Allergies  Allergen Reactions  . Brimonidine Tartrate     REACTION: eyes red and swollen  . Cosopt     REACTION: eyes red and swollen  . Lisinopril     REACTION: cough  . Pseudoephedrine     REACTION: heart races        Objective:   Physical  Exam Physical Exam: Vital signs reviewed WUJ:WJXB is a well-developed well-nourished alert cooperative  white female who appears her stated age in no acute distress.  HEENT: normocephalic a traumatic , Eyes: EOM's full, conjunctiva clear, Nares: paten,t no deformity discharge or tenderness., Ears: no deformity EAC's clear TMs with normal landmarks. Mouth: clear OP, no lesions, edema.  Moist mucous membranes. Dentition in adequate repair. NECK: supple without masses, thyromegaly   Bilateral carotid bruits  CHEST/PULM:  Clear to auscultation and percussion breath sounds equal no wheeze , rales or rhonchi. No chest wall deformities or tenderness. CV: PMI is nondisplaced, S1 S2 no gallops, murmurs, rubs. Peripheral pulses are full without delay.No JVD .  ABDOMEN: Bowel sounds normal nontender  No guard or rebound, no hepato splenomegal no CVA tenderness.  No hernia. Extremtities:  No clubbing cyanosis or edema, no acute joint swelling or redness no focal atrophy NEURO:  Oriented x3, cranial nerves 3-12 appear to be intact, no obvious focal weakness,gait within normal limits no abnormal reflexes or asymmetrical SKIN: No acute rashes normal turgor, color, no bruising or petechiae. PSYCH: Oriented, good eye contact, no obvious depression anxiety, cognition and judgment appear normal. Oriented x 3 and no noted deficits in memory, attention, and speech.  ehr record review     Assessment & Plan:  hypertension   White coat effect noted but apparently doing ok LBBB Hx of mild decrease in lv function  prob from ht LIPIDS; fear of statin meds  carotid disease asymptomatic  Has bilateral bruits   Exam and doppler Family stress feels doing well

## 2011-08-25 NOTE — Patient Instructions (Signed)
Need to follow up with cardiology .  About follow up of carotid disease.  Repeat dopplers . Continue on same  Medication for blood pressure control.  It is good today. Your cholesterol is better   But keep working on this to get it even lower.  Would discuss with cardiology about  Hesitancy of taking the statin medications.

## 2011-08-30 NOTE — Assessment & Plan Note (Signed)
No sx 

## 2011-10-06 ENCOUNTER — Telehealth: Payer: Self-pay | Admitting: Internal Medicine

## 2011-10-06 NOTE — Telephone Encounter (Signed)
Open in error

## 2011-11-17 ENCOUNTER — Telehealth: Payer: Self-pay | Admitting: Internal Medicine

## 2011-11-17 NOTE — Telephone Encounter (Signed)
Pt called and said that her 73 year old daugther is req to est with Dr Fabian Sharp, but she is need to an ov asap with Dr Fabian Sharp for cpx with labs. Req to get this sch during January. Pt is aware that Dr Fabian Sharp is out of the office this wk.

## 2011-11-20 ENCOUNTER — Other Ambulatory Visit: Payer: Self-pay | Admitting: Internal Medicine

## 2011-11-24 NOTE — Telephone Encounter (Signed)
Pretty booked for rest of January but could work her in Feb 5th  Tues 11 15 abd 11 30 slots  For new patient  Slot.  We can do labs at that visit even if not fasting if  needed.

## 2011-12-01 NOTE — Telephone Encounter (Signed)
Lft vm last wk and today for pts mom to cb and sch her daughter a np ov cpx as noted. Waiting on call back.

## 2011-12-02 ENCOUNTER — Telehealth: Payer: Self-pay | Admitting: Internal Medicine

## 2011-12-02 NOTE — Telephone Encounter (Signed)
Called pts mom about est her daughter with you on 12/09/11 as noted in previous message. Her daughter can not come in that day and would like to sch np cpx at end of February. Pls advise.

## 2011-12-04 NOTE — Telephone Encounter (Signed)
This is not the pt. There is a staff message about daughter.

## 2012-01-01 ENCOUNTER — Ambulatory Visit (INDEPENDENT_AMBULATORY_CARE_PROVIDER_SITE_OTHER): Payer: Medicare Other | Admitting: Internal Medicine

## 2012-01-01 ENCOUNTER — Encounter: Payer: Self-pay | Admitting: Internal Medicine

## 2012-01-01 VITALS — BP 130/80 | HR 72 | Wt 134.0 lb

## 2012-01-01 DIAGNOSIS — L089 Local infection of the skin and subcutaneous tissue, unspecified: Secondary | ICD-10-CM

## 2012-01-01 MED ORDER — CEPHALEXIN 500 MG PO CAPS: 500.0000 mg | ORAL_CAPSULE | Freq: Two times a day (BID) | ORAL | Status: AC

## 2012-01-01 NOTE — Patient Instructions (Signed)
This looks like a small infection around the nail. Soak for 5 -10 minutes in warm soapy water at least 3 x per day( can do more)   Add antibiotic  Pills   Continue to use antibiotic ointment. Expect this to get better in the next 3-5 days . Do not pick at cuticle area .

## 2012-01-01 NOTE — Progress Notes (Signed)
  Subjective:    Patient ID: Kristina Davidson, female    DOB: January 13, 1941, 71 y.o.   MRN: [id number]  HPI Patient comes in today for SDA for  new problem evaluation. One to one and a half weeks she has noted some swelling and redness in her left ring finger. This began after some type of manipulation perhaps a cuticle. Otherwise no trauma. She has used an antibiotic ointment but no other treatment it is slowly becoming redder with no discharge but is sore. She has not had this problem before. Nice other new major change in her health.  Review of Systems Negative chest pain swelling fever or chills. Rest no change in HPI  Outpatient Prescriptions Prior to Visit  Medication Sig Dispense Refill  . aspirin 81 MG tablet Take 81 mg by mouth daily.        Marland Kitchen BENICAR HCT 20-12.5 MG per tablet TAKE 1 TABLET BY MOUTH DAILY FOR HYPERTENSION  30 tablet  3  . Cholecalciferol (VITAMIN D-3) 5000 UNITS TABS Take by mouth daily.        . fish oil-omega-3 fatty acids 1000 MG capsule Take 2 g by mouth daily.        . Magnesium 250 MG TABS Take by mouth daily.        Bertram Gala Glycol-Propyl Glycol (SYSTANE) 0.4-0.3 % SOLN Apply to eye. Use as directed       . travoprost, benzalkonium, (TRAVATAN) 0.004 % ophthalmic solution 1 drop at bedtime.        . simvastatin (ZOCOR) 20 MG tablet Take 1 tablet (20 mg total) by mouth at bedtime.  30 tablet  3       Objective:   Physical Exam WDWN in nad  Left ring finger  With  Radial side swelling and redness and  2 mm yellow area not attacked to the nail cuticale  No fluctuance  Some redness of pulp area also nl cap refill  good rom no pain on nail pressures       Assessment & Plan:  Finger infection  No abscess paronychial entry?   Local care and  And antibiotic    Expectant management.

## 2012-01-20 ENCOUNTER — Encounter: Payer: Self-pay | Admitting: Internal Medicine

## 2012-01-20 ENCOUNTER — Ambulatory Visit (INDEPENDENT_AMBULATORY_CARE_PROVIDER_SITE_OTHER): Payer: Medicare Other | Admitting: Internal Medicine

## 2012-01-20 ENCOUNTER — Ambulatory Visit (INDEPENDENT_AMBULATORY_CARE_PROVIDER_SITE_OTHER)
Admission: RE | Admit: 2012-01-20 | Discharge: 2012-01-20 | Disposition: A | Discharge status: Home / Self Care | Payer: Medicare Other | Source: Ambulatory Visit | Attending: Internal Medicine | Admitting: Internal Medicine

## 2012-01-20 VITALS — BP 130/80 | HR 78 | Temp 97.5°F

## 2012-01-20 DIAGNOSIS — T1490XA Injury, unspecified, initial encounter: Secondary | ICD-10-CM

## 2012-01-20 DIAGNOSIS — M79606 Pain in leg, unspecified: Secondary | ICD-10-CM | POA: Insufficient documentation

## 2012-01-20 DIAGNOSIS — T148XXA Other injury of unspecified body region, initial encounter: Secondary | ICD-10-CM

## 2012-01-20 DIAGNOSIS — M79609 Pain in unspecified limb: Secondary | ICD-10-CM

## 2012-01-20 NOTE — Progress Notes (Signed)
  Subjective:    Patient ID: Kristina Davidson, female    DOB: 1941/10/15, 71 y.o.   MRN: [id number]  HPI Patient comes in today for SDA for  new problem evaluation. She hobbled in during her daughter's appointment to be seen.  About 3 weeks ago a 16 quart pot fell from above and hit her on the right leg laterally and then lower leg near the ankle.  Daughter caught the top is only got hit once. Since that time she's had pain around the right lateral knee that has improved it had a mild bruise there. However she has some swelling at the right distal leg it is very tender she's been wearing a brace and picked up some crutches to help her walk along with the boot.  She can walk but carefully.  There is mild swelling around the ankle.  She denies a fall twist or a pop. She has gotten some pain in the right buttocks area since that time but did not get hit in this area.   Review of Systems Negative for fever chest pain falling neurologic changes or other joint swelling  Past history family history social history reviewed in the electronic medical record.     Objective:   Physical Exam  Well-developed well-nourished in no acute distress she has a right foot in a brace and an op boot and using crutches.  However she is ambulatory he can put weight on the leg but walks a bit flat-footed and carefully.  Back within normal limits no tender spot on the buttocks where she points to pain negative SLR Right knee without effusion faded bruise near the fibular prominence but no bony tenderness and good range of motion. Right distal extremity shows a 1-2 cm area of swelling along the distal fibula is very tender skin is intact. Ankle appears stable with some slight swelling at the lateral malleolus. Negative squeeze test. Neurovascular seems intact. Good perfusion.      Assessment & Plan:  Right lower extremity injury contusion mechanism of injury  Probably contusion without sprain however because of  three-week history will get x-ray of her distal extremities to include ankle and fibula.  I would've to wait crutches at this point because of potential danger of falling use a cane instead if needed her balance.  If x-ray is negative may give this more time to heal if persistent or progressive we can get orthopedics involved.

## 2012-01-20 NOTE — Patient Instructions (Signed)
x-rays of your right lower extremity as we discussed.  Would like to know those results. This could be a really bad bone can to shin and will take a while to feel better. I would avoid the crutches for now and use a cane for support. Sometimes crutches causes falling.   We will refer to orthopedics if we are not getting better or if there are abnormalities on the x-ray such as a fracture.

## 2012-01-20 NOTE — Progress Notes (Signed)
Quick Note:  Pt aware of results. ______ 

## 2012-02-23 ENCOUNTER — Encounter: Payer: Self-pay | Admitting: Internal Medicine

## 2012-02-23 ENCOUNTER — Ambulatory Visit (INDEPENDENT_AMBULATORY_CARE_PROVIDER_SITE_OTHER): Payer: Medicare Other | Admitting: Internal Medicine

## 2012-02-23 VITALS — BP 152/90 | HR 73 | Temp 98.2°F | Wt 132.0 lb

## 2012-02-23 DIAGNOSIS — I447 Left bundle-branch block, unspecified: Secondary | ICD-10-CM

## 2012-02-23 DIAGNOSIS — E785 Hyperlipidemia, unspecified: Secondary | ICD-10-CM

## 2012-02-23 DIAGNOSIS — I779 Disorder of arteries and arterioles, unspecified: Secondary | ICD-10-CM

## 2012-02-23 DIAGNOSIS — H409 Unspecified glaucoma: Secondary | ICD-10-CM

## 2012-02-23 DIAGNOSIS — Z6379 Other stressful life events affecting family and household: Secondary | ICD-10-CM

## 2012-02-23 DIAGNOSIS — Z638 Other specified problems related to primary support group: Secondary | ICD-10-CM

## 2012-02-23 DIAGNOSIS — I10 Essential (primary) hypertension: Secondary | ICD-10-CM

## 2012-02-23 MED ORDER — PRAVASTATIN SODIUM 20 MG PO TABS: 20.0000 mg | ORAL_TABLET | Freq: Every day | ORAL | Status: DC

## 2012-02-23 NOTE — Progress Notes (Signed)
  Subjective:    Patient ID: Kristina Davidson, female    DOB: 25-Jul-1941, 71 y.o.   MRN: [id number]  HPI Here for follow up  Of mult md issues.   Her leg and fingers better. There is a lot of stress at home with her family. She thinks that her blood pressure is doing pretty well on her Benicar 2012.5 and is up today only because of the stress. She denies any side effects of the medicine. Eyes stable although may need some intervention on the right she thinks the lens has a problem.  She hasn't taken the simvastatin as we discussed last time because she was scared that it would hurt her liver. No chest pain shortness of breath syncope. Review of Systems Negative for fever falling major weight loss neurologic signs swelling as per history of present illness  Past history family history social history reviewed in the electronic medical record. Outpatient Prescriptions Prior to Visit  Medication Sig Dispense Refill  . aspirin 81 MG tablet Take 81 mg by mouth daily.        Marland Kitchen BENICAR HCT 20-12.5 MG per tablet TAKE 1 TABLET BY MOUTH DAILY FOR HYPERTENSION  30 tablet  3  . Cholecalciferol (VITAMIN D-3) 5000 UNITS TABS Take by mouth daily.        . fish oil-omega-3 fatty acids 1000 MG capsule Take 2 g by mouth daily.        . Magnesium 250 MG TABS Take by mouth daily.        Bertram Gala Glycol-Propyl Glycol (SYSTANE) 0.4-0.3 % SOLN Apply to eye. Use as directed       . travoprost, benzalkonium, (TRAVATAN) 0.004 % ophthalmic solution 1 drop at bedtime.        . simvastatin (ZOCOR) 20 MG tablet Take 1 tablet (20 mg total) by mouth at bedtime.  30 tablet  3       Objective:   Physical Exam   BP 152/90  Pulse 73  Temp(Src) 98.2 F (36.8 C) (Oral)  Wt 132 lb (59.875 kg)  SpO2 98%  Well-developed well-nourished nourished in no acute distress slightly anxious good eye contact. Repeat blood pressure right arm 160/80. Neck: Supple without adenopathy or masses or bruits Chest:  Clear to A&P  without wheezes rales or rhonchi CV:  S1-S2 no gallops or murmurs peripheral perfusion is normal No clubbing cyanosis or edema      Assessment & Plan:  Hypertension   better readings last time; is at high risk is not too interested in changing medication unless absolutely necessary and feels that her readings have been okay up until today. One option would be to increase her Benicar components or add Norvasc amlodipine at this time she is willing to come back in about 6-7 weeks recheck her blood pressure make adjustments at that time if needed. LBBB LIPIDS. With carotid artery disease. She had anxiety and cautioned about starting on the simvastatin we'll change the plan of action to low-dose Pravachol which has fewer side effects and reviewed reasoning behind adding another medication. She states that she is willing to try this and we will check laboratory studies in about 6-7 weeks and then followup. She should call us if we need to change the plan of action.  Stress at home discussed briefly strategies.  Total visit > 50% spent counseling and coordinating care

## 2012-02-23 NOTE — Patient Instructions (Signed)
Your blood pressure is too high today although I agree it could be from some recent stress.  We need to make sure that it is in control.  Also I still highly recommend cholesterol medication as well as the cardiologist recommendation to try to prevent progression of the cholesterol buildup in the arteries.  We can use generic Pravachol which has somewhat diffuse side effects. I am not worried that it will affect her liver in a negative way at this time. Begin in the evening one a day. We will get laboratory studies in about 6 weeks and then have you come back in see what her cholesterol is and your blood pressure. Call in the meantime if this is not going to work for you.

## 2012-02-27 ENCOUNTER — Other Ambulatory Visit: Payer: Self-pay | Admitting: Internal Medicine

## 2012-02-27 DIAGNOSIS — E785 Hyperlipidemia, unspecified: Secondary | ICD-10-CM

## 2012-02-27 DIAGNOSIS — I10 Essential (primary) hypertension: Secondary | ICD-10-CM

## 2012-04-06 ENCOUNTER — Other Ambulatory Visit: Payer: Medicare Other

## 2012-04-08 ENCOUNTER — Other Ambulatory Visit (INDEPENDENT_AMBULATORY_CARE_PROVIDER_SITE_OTHER): Payer: Medicare Other

## 2012-04-08 DIAGNOSIS — E785 Hyperlipidemia, unspecified: Secondary | ICD-10-CM

## 2012-04-08 DIAGNOSIS — I10 Essential (primary) hypertension: Secondary | ICD-10-CM

## 2012-04-08 LAB — HEPATIC FUNCTION PANEL: Total Bilirubin: 0.8 mg/dL (ref 0.3–1.2)

## 2012-04-08 LAB — LIPID PANEL
HDL: 79.7 mg/dL (ref >39.00)
Total CHOL/HDL Ratio: 2
VLDL: 7.8 mg/dL (ref 0.0–40.0)

## 2012-04-08 LAB — BASIC METABOLIC PANEL
CO2: 29 mEq/L (ref 19–32)
Glucose, Bld: 81 mg/dL (ref 70–99)
Potassium: 4.3 mEq/L (ref 3.5–5.1)
Sodium: 137 mEq/L (ref 135–145)

## 2012-04-13 ENCOUNTER — Ambulatory Visit (INDEPENDENT_AMBULATORY_CARE_PROVIDER_SITE_OTHER): Payer: Medicare Other | Admitting: Internal Medicine

## 2012-04-13 ENCOUNTER — Encounter: Payer: Self-pay | Admitting: Internal Medicine

## 2012-04-13 VITALS — BP 145/80 | HR 80 | Temp 98.4°F | Wt 127.0 lb

## 2012-04-13 DIAGNOSIS — IMO0001 Reserved for inherently not codable concepts without codable children: Secondary | ICD-10-CM

## 2012-04-13 DIAGNOSIS — I10 Essential (primary) hypertension: Secondary | ICD-10-CM

## 2012-04-13 DIAGNOSIS — E785 Hyperlipidemia, unspecified: Secondary | ICD-10-CM

## 2012-04-13 DIAGNOSIS — T887XXA Unspecified adverse effect of drug or medicament, initial encounter: Secondary | ICD-10-CM

## 2012-04-13 NOTE — Progress Notes (Signed)
  Subjective:    Patient ID: Kristina Davidson, female    DOB: 05/06/1941, 71 y.o.   MRN: [id number]  HPI Pt comesin today for fu lipids and ht   See last visit she tried the pravastatin for about 3 days and noticed that her hands were swollen and it was particular ring off so she stopped the medication. No other side effect. She continues to take the Benicar HCTZ without side effect. She thinks it does well. 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  No chest pain shortness of breath or syncope. Review of Systems No bleeding major change in vision Past history family history social history reviewed in the electronic medical record.    Objective:   Physical Exam BP 170/90  Pulse 80  Temp(Src) 98.4 F (36.9 C) (Oral)  Wt 127 lb (57.607 kg)  SpO2 97%  Well-developed well-nourished in no acute distress blood pressure right arm 145/80 distracted. cvc  rr No clubbing cyanosis or edema Neuro intact  Laboratory studies reviewed LDL ratio down to 2.    Assessment & Plan:  Hypertension:  White coat effect inaddition to risk.  No side effects of medications samples given to continue on medication she may benefit from an antianxiety agent before procedures because she does get very anxious. Call when she needs letter for dental work . She is a bit resistant to changing medication or adding new ones because she feels she is doing well at present. Because we have checked her machine against ours in the office we will continue the same. Samples of Benicar HCTZ given to her today.  History of LBBB and carotid artery changes. No sx  LIPIDS:  Unusual to have side effect of finger swelling on pravastatin however anything is possible at this time I would have her try again to see if it recurs. Glaucoma under  treatment.  Total visit > 50% spent counseling and coordinating care

## 2012-04-13 NOTE — Assessment & Plan Note (Signed)
White coat effect ibnaddition to risk.  No side effects of medications samples given to continue on medication she may benefit from an antianxiety agent before procedures because she does get very anxious.

## 2012-04-13 NOTE — Patient Instructions (Addendum)
Continue blood pressure  medication . Check your readings occassionally to make sure they are still in range. Contact us when you need a letter for the dental visits. Suggest retry the pravastatin medication again . The swelling you had  may  Have been coincidence but if comes back again it could be the medication.  rov in 6 months or as needed

## 2012-05-19 ENCOUNTER — Other Ambulatory Visit: Payer: Self-pay | Admitting: Internal Medicine

## 2012-08-25 ENCOUNTER — Encounter: Payer: Self-pay | Admitting: Internal Medicine

## 2012-08-25 ENCOUNTER — Ambulatory Visit (INDEPENDENT_AMBULATORY_CARE_PROVIDER_SITE_OTHER): Payer: Medicare Other | Admitting: Family Medicine

## 2012-08-25 DIAGNOSIS — Z23 Encounter for immunization: Secondary | ICD-10-CM

## 2012-09-23 IMAGING — CR DG ANKLE COMPLETE 3+V*R*
3 series · 3 of 3 positions shown · non-contrast
Comparison: None.

CLINICAL DATA: Right ankle pain following an injury 3 weeks ago.

RIGHT ANKLE - COMPLETE 3+ VIEW

[view not recorded (1 of 3)]
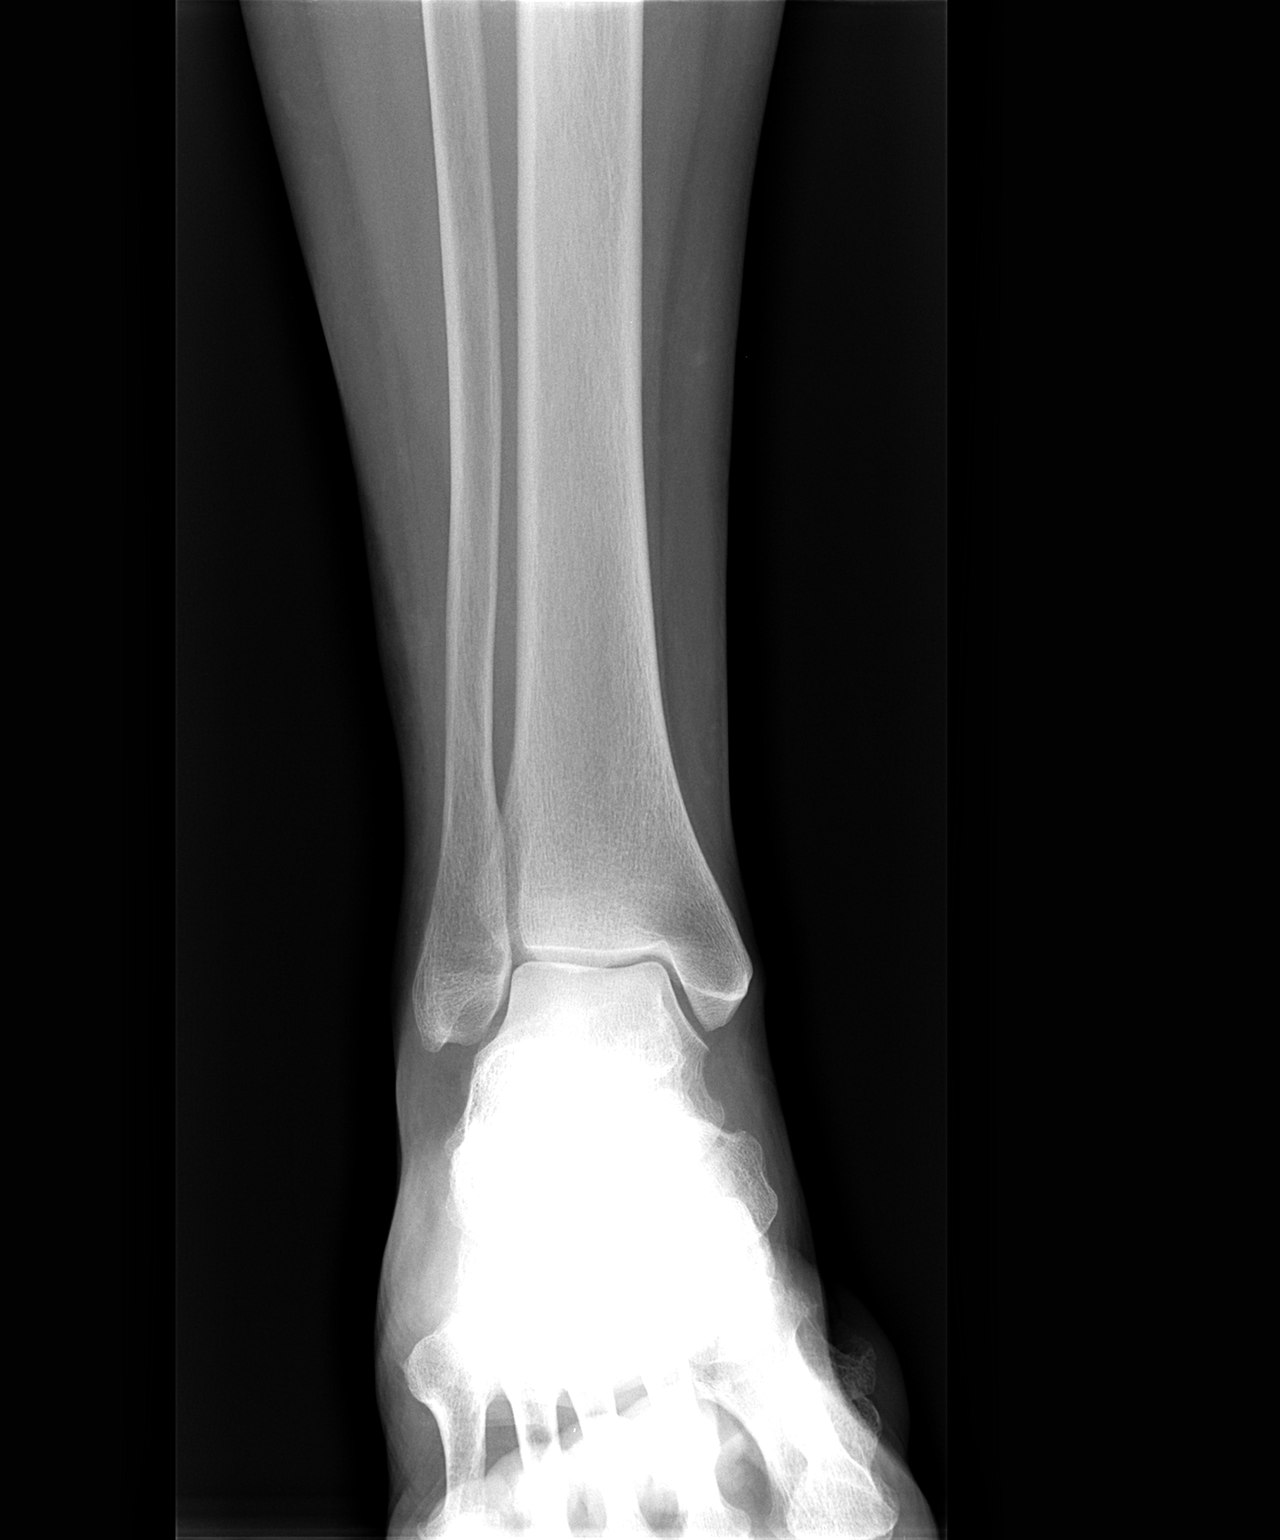

[view not recorded (2 of 3)]
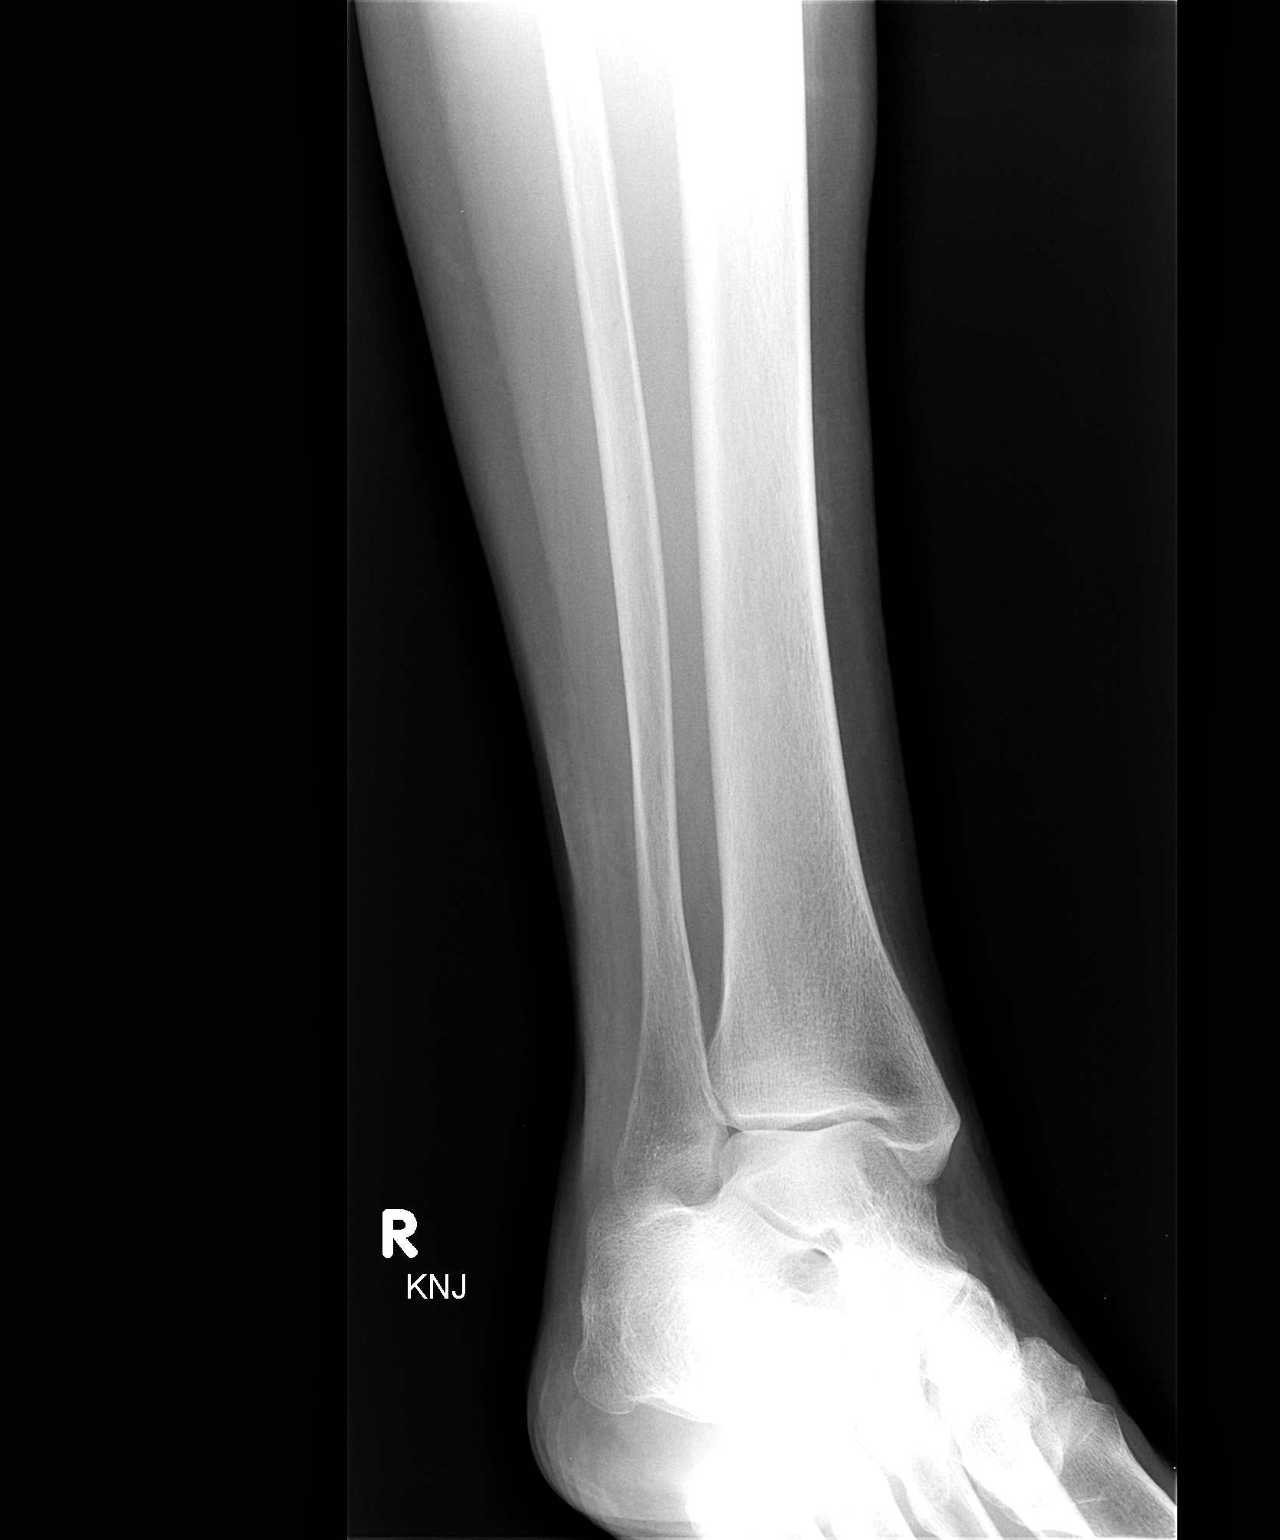

[view not recorded (3 of 3)]
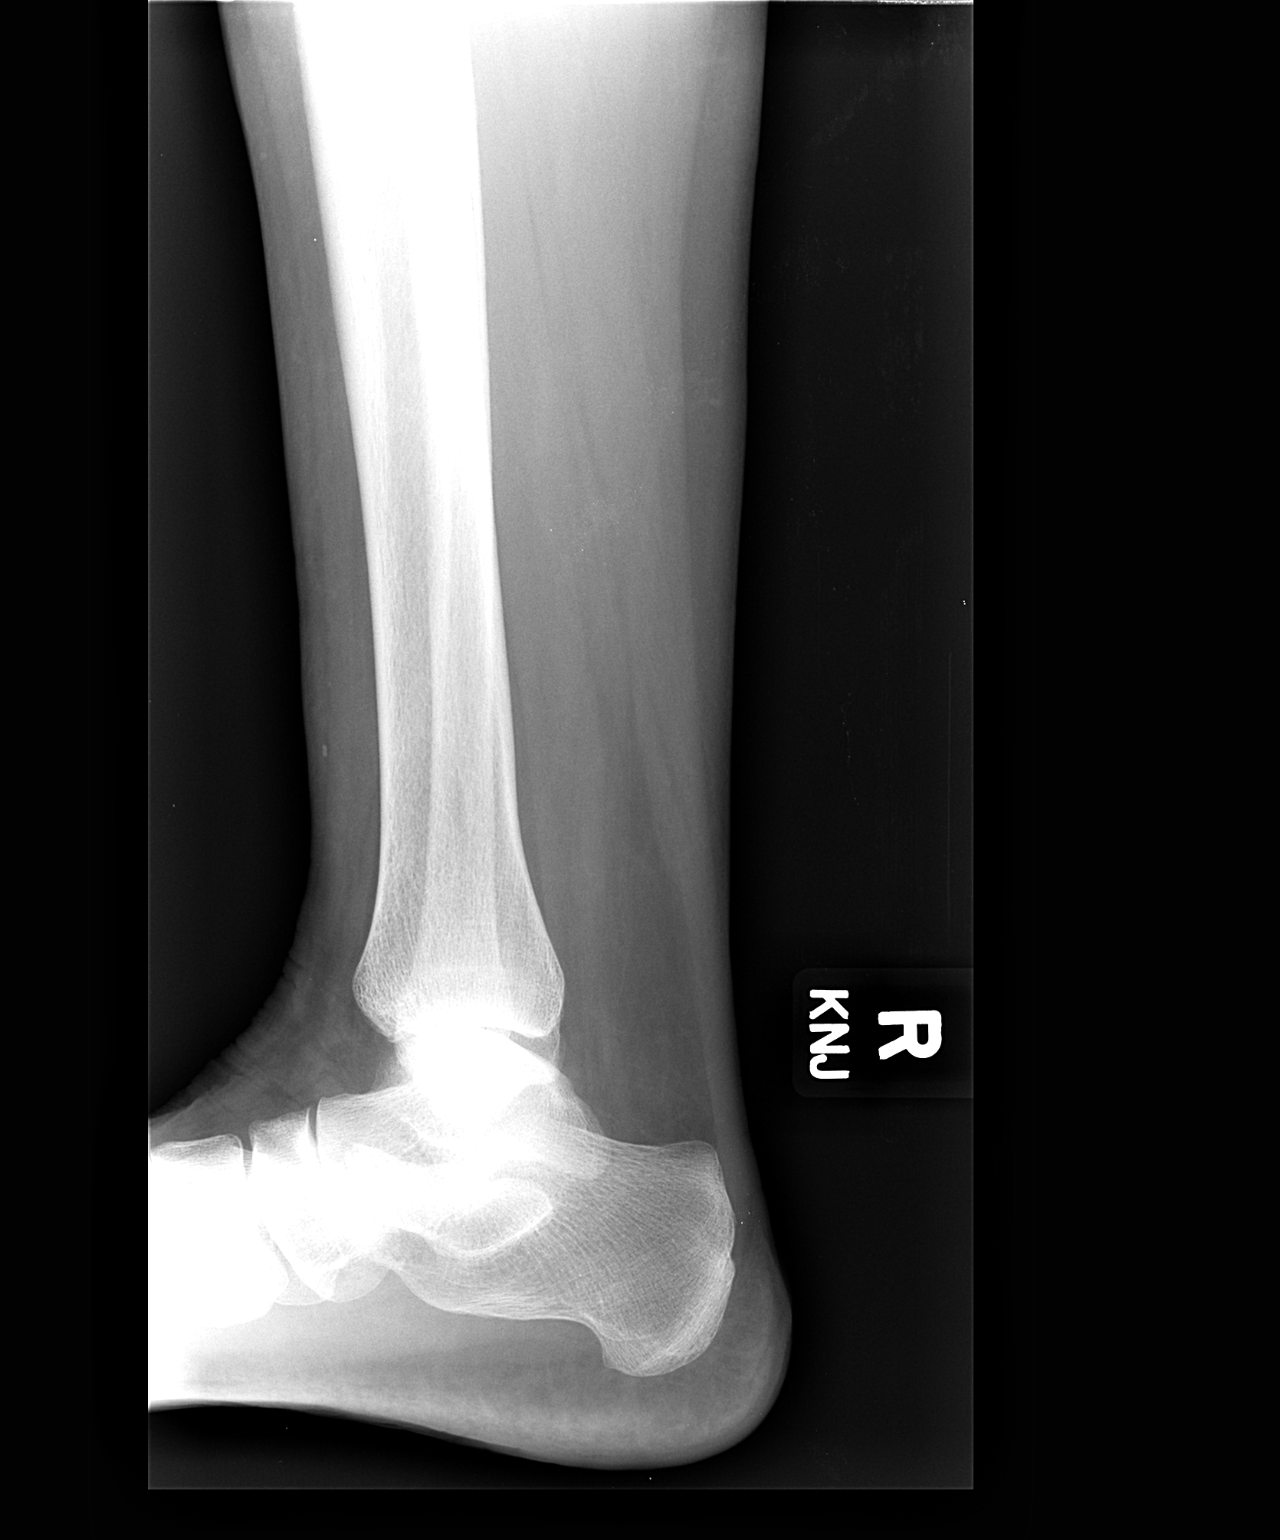

[3 of 3 positions shown; findings below may reference images not displayed]

FINDINGS: Three views of the right ankle demonstrate normal
appearing bones and soft tissues without fracture, dislocation or
effusion.
IMPRESSION: Normal examination.

## 2012-10-01 ENCOUNTER — Other Ambulatory Visit: Payer: Self-pay | Admitting: Cardiology

## 2012-10-01 DIAGNOSIS — I6529 Occlusion and stenosis of unspecified carotid artery: Secondary | ICD-10-CM

## 2012-10-05 ENCOUNTER — Encounter (INDEPENDENT_AMBULATORY_CARE_PROVIDER_SITE_OTHER): Payer: Medicare Other

## 2012-10-05 DIAGNOSIS — I6529 Occlusion and stenosis of unspecified carotid artery: Secondary | ICD-10-CM

## 2012-10-13 ENCOUNTER — Encounter: Payer: Self-pay | Admitting: Internal Medicine

## 2012-10-13 ENCOUNTER — Ambulatory Visit (INDEPENDENT_AMBULATORY_CARE_PROVIDER_SITE_OTHER): Payer: Medicare Other | Admitting: Internal Medicine

## 2012-10-13 VITALS — BP 160/90 | HR 83 | Temp 97.7°F | Wt 128.0 lb

## 2012-10-13 DIAGNOSIS — T887XXA Unspecified adverse effect of drug or medicament, initial encounter: Secondary | ICD-10-CM

## 2012-10-13 DIAGNOSIS — I10 Essential (primary) hypertension: Secondary | ICD-10-CM

## 2012-10-13 DIAGNOSIS — M25521 Pain in right elbow: Secondary | ICD-10-CM

## 2012-10-13 DIAGNOSIS — I429 Cardiomyopathy, unspecified: Secondary | ICD-10-CM

## 2012-10-13 DIAGNOSIS — I779 Disorder of arteries and arterioles, unspecified: Secondary | ICD-10-CM

## 2012-10-13 DIAGNOSIS — M25529 Pain in unspecified elbow: Secondary | ICD-10-CM

## 2012-10-13 DIAGNOSIS — I428 Other cardiomyopathies: Secondary | ICD-10-CM

## 2012-10-13 NOTE — Patient Instructions (Addendum)
Your blood pressure is still high in the office. Get blood pressure readings at home or out of office.  Record the readings.   ROV  in 2 months with the machine and the readings .  Artery is stable  . Cholesterol medication can reduce the risk of stroke.  Continue aspirin and blood pressure medication.  Want you to retry the pravastatin  Medication   If  Hands swell again then stop and contact our office  If no side effect then continue until we see you again.   Try elbow padding   Tylenol   If  persistent or progressive contact us and consider sports medicine evaluation.

## 2012-10-13 NOTE — Progress Notes (Signed)
Chief Complaint  Patient presents with  . Follow-up    BP carotid doppler    HPI: Comes in for follow up of  multiple medical issues Since last visit hasnt checked her BP but says she feels fine and taking med. No edema cp sob .  Did have a carotid doppler fu from last year  . Eye disease no change  Didn't retry the pravastatin as advised  Worry about se poss.  Has right elbow pain  No fall poss after using a lot  No swelling hurts only to touch and under certain situations no fever or redness or swelling ROS: See pertinent positives and negatives per HPI. No bleeding falling rest as per hpi  Past Medical History  Diagnosis Date  . Hyperlipidemia   . Left bundle branch block   . Hypertension   . Cardiomyopathy   . Vitamin D deficiency   . Glaucoma(365)     dev vision left eye  . Varicella     Family History  Problem Relation Age of Onset  . Coronary artery disease Other   . Hypertension Other   . Kidney disease Other   . Heart disease Father     History   Social History  . Marital Status: Married    Spouse Name: N/A    Number of Children: N/A  . Years of Education: N/A   Social History Main Topics  . Smoking status: Never Smoker   . Smokeless tobacco: None  . Alcohol Use: No  . Drug Use: No  . Sexually Active: None   Other Topics Concern  . 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 aspirin 81 MG tablet Take 81 mg by mouth daily.        Marland Kitchen BENICAR HCT 20-12.5 MG per tablet TAKE 1 TABLET BY MOUTH DAILY FOR HYPERTENSION  30 tablet  5  . Cholecalciferol (VITAMIN D-3) 5000 UNITS TABS Take by mouth daily.        . fish oil-omega-3 fatty acids 1000 MG capsule Take 2 g by mouth daily.        . Magnesium 250 MG TABS Take by mouth daily.        Bertram Gala Glycol-Propyl Glycol (SYSTANE) 0.4-0.3 % SOLN Apply to  eye. Use as directed       . pravastatin (PRAVACHOL) 20 MG tablet Take 1 tablet (20 mg total) by mouth daily.  30 tablet  3  . travoprost, benzalkonium, (TRAVATAN) 0.004 % ophthalmic solution 1 drop at bedtime.          EXAM:  BP 160/90  Pulse 83  Temp 97.7 F (36.5 C) (Oral)  Wt 128 lb (58.06 kg)  SpO2 99%  There is no height on file to calculate BMI.  GENERAL: vitals reviewed and listed above, alert, oriented, appears well hydrated and in no acute distress  HEENT: atraumatic,, no obvious abnormalities on inspection of external nose and ears OP : no lesion edema or exudate   NECK: no obvious masses on inspection palpation  Bruit on right   LUNGS: clear to auscultation bilaterally, no wheezes, rales or rhonchi, good air movement  CV: HRRR, no clubbing cyanosis or  peripheral edema nl cap refill   MS: moves all extremities without noticeable focal  Abnormality right elbow area with poss  Tenderness at olecronon but no swelling redness or  deformity   PSYCH: pleasant and cooperative, no obvious depression  Mild anxiety  Lab Results  Component Value Date   WBC 5.8 07/16/2010   HGB 13.2 07/16/2010   HCT 39.3 07/16/2010   PLT 223.0 07/16/2010   GLUCOSE 81 04/08/2012   CHOL 172 04/08/2012   TRIG 39.0 04/08/2012   HDL 79.70 04/08/2012   LDLDIRECT 116.3 07/29/2011   LDLCALC 85 04/08/2012   ALT 15 04/08/2012   AST 22 04/08/2012   NA 137 04/08/2012   K 4.3 04/08/2012   CL 101 04/08/2012   CREATININE 0.8 04/08/2012   BUN 13 04/08/2012   CO2 29 04/08/2012   TSH 4.88 07/29/2011    ASSESSMENT AND PLAN:  Discussed the following assessment and plan:  1. HYPERTENSION    seems out of control lots of fear of se of meds in past documented normal at home but need to reassess  2. Carotid artery disease    stable statin advised but concern about se will retry med  3. Cardiomyopathy    no sx per pt   4. Right elbow pain    poss overuse exam good consider conservative measures padding and fu if needed ortho SM   5. Medication side effect ? swelling fingers on pravachol.    never retried  uncertain if really the  med asked her to retry she may not cause of worry about se poss.    -Patient advised to return or notify health care team  immediately if symptoms worsen or persist or new concerns arise.  Patient Instructions  Your blood pressure is still high in the office. Get blood pressure readings at home or out of office.  Record the readings.   ROV  in 2 months with the machine and the readings .  Artery is stable  . Cholesterol medication can reduce the risk of stroke.  Continue aspirin and blood pressure medication.  Want you to retry the pravastatin  Medication   If  Hands swell again then stop and contact our office  If no side effect then continue until we see you again.   Try elbow padding   Tylenol   If  persistent or progressive contact us and consider sports medicine evaluation.    Neta Mends. Kiaya Haliburton M.D.  Total visit 22 mins > 50% spent counseling about  Medications trial and importance of control of factors for her CV health she is extremely cautious about beginning new meds

## 2012-10-15 DIAGNOSIS — M25521 Pain in right elbow: Secondary | ICD-10-CM | POA: Insufficient documentation

## 2012-12-13 ENCOUNTER — Telehealth: Payer: Self-pay | Admitting: Internal Medicine

## 2012-12-13 NOTE — Telephone Encounter (Signed)
Pt was instructed to call in if she continued to have adverse reaction to pravastatin (PRAVACHOL) 20 MG tablet. Pt did and has stopped taking. (Joints aching)

## 2012-12-14 NOTE — Telephone Encounter (Signed)
Left message at home number for the pt to return my call. 

## 2012-12-14 NOTE — Telephone Encounter (Signed)
Noted will put on your list that cannot take .  Still need fu of BP and machine check as noted in last note

## 2012-12-21 ENCOUNTER — Ambulatory Visit: Payer: Medicare Other | Admitting: Internal Medicine

## 2012-12-21 NOTE — Telephone Encounter (Signed)
Left message on home/cell for the pt to return my call. 

## 2012-12-22 ENCOUNTER — Encounter: Payer: Self-pay | Admitting: Internal Medicine

## 2012-12-22 NOTE — Telephone Encounter (Signed)
Left message on home/cell for the pt to return my call.  Have tried several times to make contact with the patient.  Will now send a letter.

## 2013-01-20 ENCOUNTER — Encounter: Payer: Self-pay | Admitting: Internal Medicine

## 2013-01-20 ENCOUNTER — Ambulatory Visit (INDEPENDENT_AMBULATORY_CARE_PROVIDER_SITE_OTHER): Payer: Medicare Other | Admitting: Internal Medicine

## 2013-01-20 VITALS — BP 144/80 | HR 68 | Temp 97.5°F | Wt 126.0 lb

## 2013-01-20 DIAGNOSIS — E785 Hyperlipidemia, unspecified: Secondary | ICD-10-CM

## 2013-01-20 DIAGNOSIS — I779 Disorder of arteries and arterioles, unspecified: Secondary | ICD-10-CM

## 2013-01-20 DIAGNOSIS — I10 Essential (primary) hypertension: Secondary | ICD-10-CM

## 2013-01-20 DIAGNOSIS — I119 Hypertensive heart disease without heart failure: Secondary | ICD-10-CM

## 2013-01-20 DIAGNOSIS — T50905A Adverse effect of unspecified drugs, medicaments and biological substances, initial encounter: Secondary | ICD-10-CM

## 2013-01-20 DIAGNOSIS — Z888 Allergy status to other drugs, medicaments and biological substances status: Secondary | ICD-10-CM

## 2013-01-20 DIAGNOSIS — T887XXA Unspecified adverse effect of drug or medicament, initial encounter: Secondary | ICD-10-CM

## 2013-01-20 DIAGNOSIS — IMO0001 Reserved for inherently not codable concepts without codable children: Secondary | ICD-10-CM

## 2013-01-20 DIAGNOSIS — H409 Unspecified glaucoma: Secondary | ICD-10-CM

## 2013-01-20 DIAGNOSIS — Z789 Other specified health status: Secondary | ICD-10-CM

## 2013-01-20 MED ORDER — NIACIN ER (ANTIHYPERLIPIDEMIC) 1000 MG PO TBCR: 1000.0000 mg | EXTENDED_RELEASE_TABLET | Freq: Every day | ORAL | Status: DC

## 2013-01-20 NOTE — Patient Instructions (Addendum)
Get   blood pressure readings when you can . Was a bit better today 140/80 range   Since you cant take statin medication   Consider  niaspan a niacin derivative for   cholesterol and heart attack prevention.    If you decide to try the niacin then we need to get blood tests  Lipid  And liver panel. and  Follow up visit  In 2 months    Otherwise   Plan ROV in 4 months .     Niacin extended-release tablets or capsules What is this medicine? NIACIN (NYE a sin) is used in combination with a healthy diet to lower bad cholesterol and increase good cholesterol. This medicine is also used to decrease triglycerides. If triglycerides are too high, you may be at risk of developing pancreatitis. This is a painful condition that causes inflammation of the pancreas and can lead to serious health problems. This medicine can also be helpful in patients who have heart disease or who have had a heart attack. This medicine may be used for other purposes; ask your health care provider or pharmacist if you have questions. What should I tell my health care provider before I take this medicine? They need to know if you have any of these conditions: -bleeding problems -frequently drink alcoholic beverages -liver disease -ulcers of intestine or stomach -an unusual or allergic reaction to niacin, other medicines, foods, dyes, or preservatives -pregnant or trying or get pregnant -breast-feeding How should I use this medicine? Take this medicine by mouth with a glass of water. Follow the directions on the prescription label. Do not crush, cut, or chew. Take with a low-fat meal or snack. Take your doses at regular intervals. Do not take your medicine more often than directed. Do not stop taking except on the advice of your doctor or health care professional. Talk to your pediatrician regarding the use of this medicine in children. Special care may be needed. Overdosage: If you think you have taken too much of this  medicine contact a poison control center or emergency room at once. NOTE: This medicine is only for you. Do not share this medicine with others. What if I miss a dose? If you miss a dose, take it as soon as you can. If it is almost time for your next dose, take only that dose. Do not take double or extra doses. What may interact with this medicine? -aspirin -medicines for blood pressure, chest pain, or heart disease -nitroglycerin -nutritional supplements that contain niacin or nicotinamide -other medicines to lower cholesterol or triglycerides This list may not describe all possible interactions. Give your health care provider a list of all the medicines, herbs, non-prescription drugs, or dietary supplements you use. Also tell them if you smoke, drink alcohol, or use illegal drugs. Some items may interact with your medicine. What should I watch for while using this medicine? Visit your doctor or health care professional for regular checks on your progress. You may need regular tests to make sure your liver is working properly. You may get drowsy or dizzy. Do not drive, use machinery, or do anything that needs mental alertness until you know how this drug affects you. Do not stand or sit up quickly, especially if you are an older patient. This reduces the risk of dizzy or fainting spells. Do not drink hot drinks or alcohol at the same time you take this medicine. Hot drinks and alcohol can increase the flushing caused by this medicine, which can  be uncomfortable. Alcohol also can increase possible dizziness. This drug is only part of a total heart-health program. Your doctor or a dietician can suggest a low-cholesterol and low-fat diet to help. Avoid alcohol and smoking, and keep a proper exercise schedule. If you are diabetic, close monitoring of your blood sugars can help your blood fat levels. This medicine may change the way your diabetic medicine works, and sometimes will require that your  dosages be adjusted. Check with your doctor or health care professional. You may notice the empty shell of the tablet in your stool. This is no cause for concern. What side effects may I notice from receiving this medicine? Side effects that you should report to your doctor or health care professional as soon as possible: -dark yellow or brown urine -fainting spells -grayish stool color -nausea, vomiting -palpitations -severe stomach pain and loss of appetite -shortness of breath, wheezing -skin rash and itching -weakness or tiredness -yellowing of skin or eyes Side effects that usually do not require medical attention (report to your doctor or health care professional if they continue or are bothersome): -diarrhea -headache -stomach discomfort or bloating This list may not describe all possible side effects. Call your doctor for medical advice about side effects. You may report side effects to FDA at 1-800-FDA-1088. Where should I keep my medicine? Keep out of the reach of children. Store at room temperature between 20 and 25 degrees C (68 and 77 degrees F). Throw away any unused medicine after the expiration date. NOTE: This sheet is a summary. It may not cover all possible information. If you have questions about this medicine, talk to your doctor, pharmacist, or health care provider.  2013, [employer]. (01/06/2008 4:22:37 PM)
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
## 2013-04-19 ENCOUNTER — Other Ambulatory Visit: Payer: Self-pay | Admitting: Internal Medicine

## 2013-05-26 ENCOUNTER — Ambulatory Visit (INDEPENDENT_AMBULATORY_CARE_PROVIDER_SITE_OTHER): Payer: Medicare Other | Admitting: Internal Medicine

## 2013-05-26 ENCOUNTER — Encounter: Payer: Self-pay | Admitting: Internal Medicine

## 2013-05-26 VITALS — BP 144/80 | HR 60 | Temp 97.8°F | Wt 123.0 lb

## 2013-05-26 DIAGNOSIS — E785 Hyperlipidemia, unspecified: Secondary | ICD-10-CM

## 2013-05-26 DIAGNOSIS — H409 Unspecified glaucoma: Secondary | ICD-10-CM

## 2013-05-26 DIAGNOSIS — I10 Essential (primary) hypertension: Secondary | ICD-10-CM

## 2013-05-26 DIAGNOSIS — Z789 Other specified health status: Secondary | ICD-10-CM

## 2013-05-26 DIAGNOSIS — Z888 Allergy status to other drugs, medicaments and biological substances status: Secondary | ICD-10-CM

## 2013-05-26 DIAGNOSIS — I119 Hypertensive heart disease without heart failure: Secondary | ICD-10-CM

## 2013-05-26 DIAGNOSIS — I779 Disorder of arteries and arterioles, unspecified: Secondary | ICD-10-CM

## 2013-05-26 LAB — LIPID PANEL
LDL Cholesterol: 106 mg/dL — ABNORMAL HIGH (ref 0–99)
Total CHOL/HDL Ratio: 2
Triglycerides: 40 mg/dL (ref 0.0–149.0)

## 2013-05-26 LAB — BASIC METABOLIC PANEL
BUN: 16 mg/dL (ref 6–23)
CO2: 28 mEq/L (ref 19–32)
Chloride: 100 mEq/L (ref 96–112)
Creatinine, Ser: 0.9 mg/dL (ref 0.4–1.2)

## 2013-05-26 NOTE — Patient Instructions (Addendum)
Continue lifestyle intervention healthy eating and exercise . Continue blood pressure  Medication .  Can take the niacin if not bothering your\    for cholesterol.     Will notify you  of labs when available. If ok then ROV in 6 months  Or so .   Do stool cards   For colon cancer screening.    Until you decide on colonoscopy.

## 2013-05-26 NOTE — Progress Notes (Signed)
Chief Complaint  Patient presents with  . Follow-up    HPI: Patient comes in today for follow up of  multiple medical problems.   Since last visit.  Taking bp med dailu but not really checking much  Taking otc ir niacin some took today but not every day  No se reported . ? dose No major changes in [facility]  But had some episodes  Something bit her in yard   In may  Multiple bites.  Ran over snake in yard buyt no bite noted  Had  Problems walking   Pain back after a trip   At some time but better ,  Neck to spinte. Fine now.  No fever rashes .  "Poison   myself with cleaners " clhorox  Hard to breath.    Didn't seek care.    Had sob  No hx   Asthma. Fine now   ROS: See pertinent positives and negatives per HPI. No cp sob  Didn't do colonoscopy chickened out.   Past Medical History  Diagnosis Date  . Hyperlipidemia   . Left bundle branch block   . Hypertension   . Cardiomyopathy   . Vitamin D deficiency   . Glaucoma     dev vision left eye  . Varicella     Family History  Problem Relation Age of Onset  . Coronary artery disease Other   . Hypertension Other   . Kidney disease Other   . Heart disease Father     History   Social History  . Marital Status: Married    Spouse Name: N/A    Number of Children: N/A  . Years of Education: N/A   Social History Main Topics  . Smoking status: Never Smoker   . Smokeless tobacco: None  . Alcohol Use: No  . Drug Use: No  . Sexually Active: None   Other Topics Concern  . None   Social History Narrative   Retired   [employer] of 3   Married   Regular exercise- no   Grandchild with special needs      G3P3    Outpatient Encounter Prescriptions as of 05/26/2013  Medication Sig Dispense Refill  . aspirin 81 MG tablet Take 81 mg by mouth daily.        Marland Kitchen BENICAR HCT 20-12.5 MG per tablet TAKE 1 TABLET BY MOUTH DAILY FOR HYPERTENSION  30 tablet  5  . Cholecalciferol (VITAMIN D-3) 5000 UNITS TABS Take by mouth daily.        . COD  LIVER OIL PO Take by mouth.      . Magnesium 250 MG TABS Take by mouth daily.        . niacin (NIASPAN) 1000 MG CR tablet Take 1 tablet (1,000 mg total) by mouth at bedtime.  30 tablet  3  . Polyethyl Glycol-Propyl Glycol (SYSTANE) 0.4-0.3 % SOLN Apply to eye. Use as directed       . travoprost, benzalkonium, (TRAVATAN) 0.004 % ophthalmic solution 1 drop at bedtime.        . [DISCONTINUED] fish oil-omega-3 fatty acids 1000 MG capsule Take 2 g by mouth daily.         No facility-administered encounter medications on file as of 05/26/2013.    EXAM:  BP 144/80  Pulse 60  Temp(Src) 97.8 F (36.6 C) (Oral)  Wt 123 lb (55.792 kg)  BMI 22.49 kg/m2  SpO2 98%  Body mass index is 22.49 kg/(m^2).  GENERAL: vitals reviewed  and listed above, alert, oriented, appears well hydrated and in no acute distress HEENT: atraumatic, no obvious abnormalities on inspection of external nose and ears NECK: no obvious masses on inspection palpation  LUNGS: clear to auscultation bilaterally, no wheezes, rales or rhonchi, good air movement CV: HRRR, no clubbing cyanosis or  peripheral edema nl cap refill  MS: moves all extremities without noticeable focal  Abnormality thickened know right achilles from grocery cart injury  Nl gait and good balance  PSYCH: pleasant and cooperative, no obvious depression  Anxiety about the same  Good eye contact Lab Results  Component Value Date   WBC 5.8 07/16/2010   HGB 13.2 07/16/2010   HCT 39.3 07/16/2010   PLT 223.0 07/16/2010   GLUCOSE 81 04/08/2012   CHOL 172 04/08/2012   TRIG 39.0 04/08/2012   HDL 79.70 04/08/2012   LDLDIRECT 116.3 07/29/2011   LDLCALC 85 04/08/2012   ALT 15 04/08/2012   AST 22 04/08/2012   NA 137 04/08/2012   K 4.3 04/08/2012   CL 101 04/08/2012   CREATININE 0.8 04/08/2012   BUN 13 04/08/2012   CO2 29 04/08/2012   TSH 4.88 07/29/2011    ASSESSMENT AND PLAN:  Discussed the following assessment and plan:  HYPERTENSION - with white coat component actually acceptable  today  due for labs  - Plan: COD LIVER OIL PO, Basic metabolic panel, Lipid panel, TSH  HYPERLIPIDEMIA NEC/NOS - try niacin every day  is statin intolerant - Plan: COD LIVER OIL PO, Basic metabolic panel, Lipid panel, TSH  Carotid artery disease - Plan: COD LIVER OIL PO, Basic metabolic panel, Lipid panel, TSH  LVH (left ventricular hypertrophy) due to hypertensive disease, without heart failure - Plan: COD LIVER OIL PO, Basic metabolic panel, Lipid panel, TSH  Statin intolerance  GLUCOMA Do stool cards  Since has been  Chickening out of colonoscopy x 2    For now     Continue same bp meds  Samples  Of  benicar hctz also for now  Safety  Discussed   Hh cleaners etc .  -Patient advised to return or notify health care team  if symptoms worsen or persist or new concerns arise.  Patient Instructions  Continue lifestyle intervention healthy eating and exercise . Continue blood pressure  Medication .  Can take the niacin if not bothering your\    for cholesterol.     Will notify you  of labs when available. If ok then ROV in 6 months  Or so .   Do stool cards   For colon cancer screening.    Until you decide on colonoscopy.      Neta Mends. Panosh M.D.

## 2013-08-24 ENCOUNTER — Ambulatory Visit (INDEPENDENT_AMBULATORY_CARE_PROVIDER_SITE_OTHER): Payer: Medicare Other | Admitting: Family Medicine

## 2013-08-24 DIAGNOSIS — Z23 Encounter for immunization: Secondary | ICD-10-CM

## 2013-11-29 ENCOUNTER — Ambulatory Visit: Payer: Medicare Other | Admitting: Internal Medicine

## 2013-11-30 ENCOUNTER — Telehealth: Payer: Self-pay

## 2013-11-30 ENCOUNTER — Ambulatory Visit (HOSPITAL_COMMUNITY): Payer: Medicare Other | Attending: Cardiology

## 2013-11-30 ENCOUNTER — Encounter: Payer: Self-pay | Admitting: Cardiology

## 2013-11-30 ENCOUNTER — Telehealth: Payer: Self-pay | Admitting: *Deleted

## 2013-11-30 DIAGNOSIS — R079 Chest pain, unspecified: Secondary | ICD-10-CM

## 2013-11-30 DIAGNOSIS — Z87891 Personal history of nicotine dependence: Secondary | ICD-10-CM | POA: Insufficient documentation

## 2013-11-30 DIAGNOSIS — I658 Occlusion and stenosis of other precerebral arteries: Secondary | ICD-10-CM | POA: Insufficient documentation

## 2013-11-30 DIAGNOSIS — R030 Elevated blood-pressure reading, without diagnosis of hypertension: Secondary | ICD-10-CM

## 2013-11-30 DIAGNOSIS — IMO0001 Reserved for inherently not codable concepts without codable children: Secondary | ICD-10-CM

## 2013-11-30 DIAGNOSIS — I6529 Occlusion and stenosis of unspecified carotid artery: Secondary | ICD-10-CM | POA: Insufficient documentation

## 2013-11-30 DIAGNOSIS — E785 Hyperlipidemia, unspecified: Secondary | ICD-10-CM | POA: Insufficient documentation

## 2013-11-30 DIAGNOSIS — R0989 Other specified symptoms and signs involving the circulatory and respiratory systems: Secondary | ICD-10-CM | POA: Insufficient documentation

## 2013-11-30 DIAGNOSIS — I10 Essential (primary) hypertension: Secondary | ICD-10-CM | POA: Insufficient documentation

## 2013-11-30 NOTE — Telephone Encounter (Signed)
An appointment was made for pt with Dr. Ena Dawley on [facility] heart care on 12/16/13 at 12:00 noon. I called pt and left a detail message  and let her know about her appointment and to advice pt  to go to the ER for her high BP. Kathlene November CMA notified.

## 2013-11-30 NOTE — Telephone Encounter (Signed)
Fax # [phone]   Pt is at [facility] presently and was suppose to have a carotid duplex but has the folling problems: BP 220/120, flu-like sxs, hx of heart problems, chest pain.   She can be seen at [facility] if a STAT referral is placed . Dr. Regis Bill is out of the office this week. Abby Potash will sign off on referral

## 2013-11-30 NOTE — Telephone Encounter (Signed)
Spoke with Francoise Schaumann and was made aware of pt leaving the office. I have attempted to call pt several times and left messages advising her of the importance of being seen either at [facility] or the ED today. Advised in message that BP is dangerously high.

## 2013-11-30 NOTE — Telephone Encounter (Signed)
Pt came in for carotid U/S according to tech pt's BP right arm was 220/120 left arm 741 systolic and that pt C/O of a like pressure in chest. Pt has never been seen in this office pt states she was seen by a cardiologist 3 years ago but did not know he name of the cardiologist. Dr. Burnis Medin was asked to fax [place] a referral for a cardiologist to see pt. While waiting for a referral I was getting ready to get the pt in a room to take pt's BP. The front desk receptionist call to let me know that pt have left because a person was waiting for her in the parking garage. [place] Dr. Regis Bill is aware and will call pt.

## 2013-11-30 NOTE — Telephone Encounter (Signed)
Message forwarded to Dr. Regis Bill and [facility] as an Kristina Davidson

## 2013-12-08 ENCOUNTER — Ambulatory Visit: Payer: Medicare Other | Admitting: Internal Medicine

## 2013-12-16 ENCOUNTER — Institutional Professional Consult (permissible substitution): Payer: Medicare Other | Admitting: Cardiology

## 2013-12-27 ENCOUNTER — Ambulatory Visit: Payer: Medicare Other | Admitting: Internal Medicine

## 2014-01-19 ENCOUNTER — Ambulatory Visit (INDEPENDENT_AMBULATORY_CARE_PROVIDER_SITE_OTHER): Payer: Medicare Other | Admitting: Internal Medicine

## 2014-01-19 ENCOUNTER — Encounter: Payer: Self-pay | Admitting: Internal Medicine

## 2014-01-19 VITALS — BP 168/80 | Temp 97.5°F | Ht 62.5 in | Wt 129.0 lb

## 2014-01-19 DIAGNOSIS — IMO0001 Reserved for inherently not codable concepts without codable children: Secondary | ICD-10-CM

## 2014-01-19 DIAGNOSIS — I779 Disorder of arteries and arterioles, unspecified: Secondary | ICD-10-CM

## 2014-01-19 DIAGNOSIS — I10 Essential (primary) hypertension: Secondary | ICD-10-CM

## 2014-01-19 DIAGNOSIS — I739 Peripheral vascular disease, unspecified: Secondary | ICD-10-CM

## 2014-01-19 DIAGNOSIS — I119 Hypertensive heart disease without heart failure: Secondary | ICD-10-CM | POA: Insufficient documentation

## 2014-01-19 DIAGNOSIS — Z87898 Personal history of other specified conditions: Secondary | ICD-10-CM

## 2014-01-19 MED ORDER — OLMESARTAN MEDOXOMIL-HCTZ 40-25 MG PO TABS: 1.0000 | ORAL_TABLET | Freq: Every day | ORAL | Status: DC

## 2014-01-19 NOTE — Assessment & Plan Note (Signed)
Was supposed to see cardiology but see notes not done

## 2014-01-19 NOTE — Progress Notes (Signed)
Chief Complaint  Patient presents with  . Follow-up    HPI: Patient comes in today for follow up of  multiple medical problems.  Had carotid doppler jan progression of left ica  No sx appts got changed cause of weather etc  Had chest tightness and high bp canceled cardiology appt in february .  And recently cause doctor wasn't going to be there   Chest tightness  She feels was "Some virus."  They canceled the appt.  hasnt rescheduled . Denies current cp sob change exercise tolerance no edema cough   Bp readings at home have been increasing in the 148 range  ROS: See pertinent positives and negatives per HPI.  Past Medical History  Diagnosis Date  . Hyperlipidemia   . Left bundle branch block   . Hypertension   . Cardiomyopathy   . Vitamin D deficiency   . Glaucoma     dev vision left eye  . Varicella     Family History  Problem Relation Age of Onset  . Coronary artery disease Other   . Hypertension Other   . Kidney disease Other   . Heart disease Father     History   Social History  . Marital Status: Married    Spouse Name: N/A    Number of Children: N/A  . Years of Education: N/A   Social History Main Topics  . Smoking status: Never Smoker   . Smokeless tobacco: None  . Alcohol Use: No  . Drug Use: No  . Sexual Activity: None   Other Topics Concern  . None   Social History Narrative   Retired   [employer] of 3   Married   Regular exercise- no   Grandchild with special needs      G3P3    Outpatient Encounter Prescriptions as of 01/19/2014  Medication Sig  . aspirin 81 MG tablet Take 81 mg by mouth daily.    . Cholecalciferol (VITAMIN D-3) 5000 UNITS TABS Take by mouth daily.    . COD LIVER OIL PO Take by mouth.  . Magnesium 250 MG TABS Take by mouth daily.    . niacin (NIASPAN) 1000 MG CR tablet Take 1 tablet (1,000 mg total) by mouth at bedtime.  Vladimir Faster Glycol-Propyl Glycol (SYSTANE) 0.4-0.3 % SOLN Apply to eye. Use as directed   . travoprost,  benzalkonium, (TRAVATAN) 0.004 % ophthalmic solution 1 drop at bedtime.    . [DISCONTINUED] BENICAR HCT 20-12.5 MG per tablet TAKE 1 TABLET BY MOUTH DAILY FOR HYPERTENSION  . olmesartan-hydrochlorothiazide (BENICAR HCT) 40-25 MG per tablet Take 1 tablet by mouth daily.    EXAM:  BP 168/80  Temp(Src) 97.5 F (36.4 C) (Oral)  Ht 5' 2.5" (1.588 m)  Wt 129 lb (58.514 kg)  BMI 23.20 kg/m2  Body mass index is 23.2 kg/(m^2).  GENERAL: vitals reviewed and listed above, alert, oriented, appears well hydrated and in no acute distress HEENT: atraumatic, conjunctiva  clear, no obvious abnormalities on inspection of external nose and ears NECK: no obvious masses on inspection palpation bp confirmed both arms Neck right bruti  No jvd LUNGS: clear to auscultation bilaterally, no wheezes, rales or rhonchi,  CV: HRRR, no clubbing cyanosis trc plus edema peripheral edema nl cap refill  MS: moves all extremities without noticeable focal  abnormality PSYCH: pleasant and cooperative, no obvious depression mildly anxious .  ASSESSMENT AND PLAN:  Discussed the following assessment and plan:  Hypertensive heart disease - last echo 2011  HTN, white coat  Carotid artery disease  Hx of precordial chest pain - resolved see message  Reviewed last echo and carotid Dopplers. She had been intolerant of statins Lipitor and pravastatin may be others. I don't think Crestor was tried.Currently on the Niaspan. Blood pressure at home is rising needs more aggressive blood pressure control samples of Benicar 40/25 given followup in about 2 months but have her reschedule appointment with cardiology in the meantime  for cardiovascular assessment. I told her that chest symptoms connect like a virus but could also be heart related. She appears to be  stable at this time. Otherwise -Patient advised to return or notify health care team  if symptoms worsen ,persist or new concerns arise. In the meantime  Patient  Instructions  Concern about the blood pressure control now and effect on your heart muscle. Increase the benicar to 40/25  Samples    Check readings at home and rov in 2 months or as needed. Please make appt with dr Stanford Breed  Also . You may be advised to get another heart echo .     Standley Brooking. Darnell Stimson M.D.  Pre visit review using our clinic review tool, if applicable. No additional management support is needed unless otherwise documented below in the visit note.

## 2014-01-19 NOTE — Patient Instructions (Signed)
Concern about the blood pressure control now and effect on your heart muscle. Increase the benicar to 40/25  Samples    Check readings at home and rov in 2 months or as needed. Please make appt with dr Stanford Breed  Also . You may be advised to get another heart echo .

## 2014-01-26 ENCOUNTER — Institutional Professional Consult (permissible substitution): Payer: Medicare Other | Admitting: Cardiology

## 2014-02-08 ENCOUNTER — Telehealth: Payer: Self-pay | Admitting: Internal Medicine

## 2014-02-08 NOTE — Telephone Encounter (Signed)
We can do lower dose   Combo  i agree   There are other combinations  Hold the bp med if her bp is below 110.  Go back down to 1/2 pill per day ( what she was on before basically)until she can pick up new samples   Get samples of  40/12.5 1 month ; on your desk  1 per day ( this is a lower dose of the diuretic )    contact us about the bp readings after switching

## 2014-02-08 NOTE — Telephone Encounter (Signed)
Pt was to let you know about her taking olmesartan-hydrochlorothiazide (BENICAR HCT) 40-25 MG per tablet . Pt thinks its too much, she has been a little dizzy.  Pt states she thinks she needs to go back to the lower dose. pls advise. Pt has not had any med today. BP is running  122/68 today.   Last night it was 96/63.  Pt has only taken the samples, has not filled the rx. Pt wuld like to know if she should take 1/2 tab of the 40 mg?

## 2014-02-09 MED ORDER — OLMESARTAN MEDOXOMIL-HCTZ 40-12.5 MG PO TABS: ORAL_TABLET | ORAL | Status: DC

## 2014-02-09 NOTE — Telephone Encounter (Signed)
I spoke to the pt and informed her that I have new samples for her per [facility]. Instructed to go back down to 1/2 tab per day and hold if bp is below 110 She will call back to notify us of new readings. Samples placed at the front desk.

## 2014-02-09 NOTE — Telephone Encounter (Signed)
Left a message at the below listed number for the pt to return my call. 

## 2014-03-29 ENCOUNTER — Ambulatory Visit: Payer: Medicare Other | Admitting: Internal Medicine

## 2014-04-04 ENCOUNTER — Encounter: Payer: Self-pay | Admitting: Internal Medicine

## 2014-04-04 ENCOUNTER — Ambulatory Visit (INDEPENDENT_AMBULATORY_CARE_PROVIDER_SITE_OTHER): Payer: Medicare Other | Admitting: Internal Medicine

## 2014-04-04 VITALS — BP 138/70 | Temp 97.9°F | Ht 62.5 in | Wt 129.0 lb

## 2014-04-04 DIAGNOSIS — IMO0001 Reserved for inherently not codable concepts without codable children: Secondary | ICD-10-CM

## 2014-04-04 DIAGNOSIS — E785 Hyperlipidemia, unspecified: Secondary | ICD-10-CM

## 2014-04-04 DIAGNOSIS — I119 Hypertensive heart disease without heart failure: Secondary | ICD-10-CM

## 2014-04-04 DIAGNOSIS — Z789 Other specified health status: Secondary | ICD-10-CM

## 2014-04-04 DIAGNOSIS — Z79899 Other long term (current) drug therapy: Secondary | ICD-10-CM

## 2014-04-04 DIAGNOSIS — Z888 Allergy status to other drugs, medicaments and biological substances status: Secondary | ICD-10-CM

## 2014-04-04 DIAGNOSIS — I10 Essential (primary) hypertension: Secondary | ICD-10-CM

## 2014-04-04 LAB — HEPATIC FUNCTION PANEL
ALBUMIN: 4 g/dL (ref 3.5–5.2)
ALK PHOS: 32 U/L — AB (ref 39–117)
ALT: 16 U/L (ref 0–35)
AST: 24 U/L (ref 0–37)
BILIRUBIN DIRECT: 0.1 mg/dL (ref 0.0–0.3)
TOTAL PROTEIN: 6.4 g/dL (ref 6.0–8.3)
Total Bilirubin: 0.9 mg/dL (ref 0.2–1.2)

## 2014-04-04 LAB — BASIC METABOLIC PANEL
BUN: 20 mg/dL (ref 6–23)
CALCIUM: 9.6 mg/dL (ref 8.4–10.5)
CHLORIDE: 102 meq/L (ref 96–112)
CO2: 28 meq/L (ref 19–32)
CREATININE: 1 mg/dL (ref 0.4–1.2)
GFR: 72.84 mL/min (ref >60.00)
GLUCOSE: 80 mg/dL (ref 70–99)
Potassium: 3.7 mEq/L (ref 3.5–5.1)
Sodium: 137 mEq/L (ref 135–145)

## 2014-04-04 LAB — LIPID PANEL
CHOL/HDL RATIO: 2
Cholesterol: 198 mg/dL (ref 0–200)
HDL: 80.1 mg/dL (ref >39.00)
LDL Cholesterol: 111 mg/dL — ABNORMAL HIGH (ref 0–99)
TRIGLYCERIDES: 37 mg/dL (ref 0.0–149.0)
VLDL: 7.4 mg/dL (ref 0.0–40.0)

## 2014-04-04 LAB — TSH: TSH: 2.28 u[IU]/mL (ref 0.35–4.50)

## 2014-04-04 MED ORDER — OLMESARTAN MEDOXOMIL-HCTZ 40-12.5 MG PO TABS: ORAL_TABLET | ORAL | Status: DC

## 2014-04-04 NOTE — Assessment & Plan Note (Signed)
Higher dsoe aof antiht caused dizziness and low bp so using home readings   Advised to check 1 week per month to ensure control

## 2014-04-04 NOTE — Patient Instructions (Signed)
Please do BP readings twice a day for  One week a month and record  And bring in to visits .  Ok to take 1/2 pill at this  Labs today .  As you are due by July . If ok . and  BP  is good then plan  ROV in 4-6 months .

## 2014-04-04 NOTE — Progress Notes (Signed)
Chief Complaint  Patient presents with  . Follow-up    HPI: Fu for medication maagment for HT  Tried higher dose of ben hctz but had low bp and se soe told to  Feel . See phone notes    Checking occassionally.  This am was 138/83  !  ROS: See pertinent positives and negatives per HPI. Mows the grass does stop to rest but no didzzines syncope cough  No edema  Past Medical History  Diagnosis Date  . Hyperlipidemia   . Left bundle branch block   . Hypertension   . Cardiomyopathy   . Vitamin D deficiency   . Glaucoma     dev vision left eye  . Varicella     Family History  Problem Relation Age of Onset  . Coronary artery disease Other   . Hypertension Other   . Kidney disease Other   . Heart disease Father     History   Social History  . Marital Status: Married    Spouse Name: N/A    Number of Children: N/A  . Years of Education: N/A   Social History Main Topics  . Smoking status: Never Smoker   . Smokeless tobacco: None  . Alcohol Use: No  . Drug Use: No  . Sexual Activity: None   Other Topics Concern  . None   Social History Narrative   Retired   [employer] of 3   Married   Regular exercise- no   Grandchild with special needs      G3P3    Outpatient Encounter Prescriptions as of 04/04/2014  Medication Sig  . aspirin 81 MG tablet Take 81 mg by mouth daily.    . Cholecalciferol (VITAMIN D-3) 5000 UNITS TABS Take by mouth daily.    . COD LIVER OIL PO Take by mouth.  . Magnesium 250 MG TABS Take by mouth daily.    . niacin (NIASPAN) 1000 MG CR tablet Take 1 tablet (1,000 mg total) by mouth at bedtime.  Marland Kitchen olmesartan-hydrochlorothiazide (BENICAR HCT) 40-12.5 MG per tablet Take 1/2 to 1 per day for hypertension  . Polyethyl Glycol-Propyl Glycol (SYSTANE) 0.4-0.3 % SOLN Apply to eye. Use as directed   . travoprost, benzalkonium, (TRAVATAN) 0.004 % ophthalmic solution 1 drop at bedtime.    . [DISCONTINUED] olmesartan-hydrochlorothiazide (BENICAR HCT) 40-12.5 MG per  tablet Half tab daily    EXAM:  BP 138/70  Temp(Src) 97.9 F (36.6 C) (Oral)  Ht 5' 2.5" (1.588 m)  Wt 129 lb (58.514 kg)  BMI 23.20 kg/m2  Body mass index is 23.2 kg/(m^2). Repeat left 140/70 sitiing GENERAL: vitals reviewed and listed above, alert, oriented, appears well hydrated and in no acute distress HEENT: atraumatic, conjunctiva  clear, no obvious abnormalities on inspection of external nose and earsNECK: no obvious masses on inspection palpation  LUNGS: clear to auscultation bilaterally, no wheezes, rales or rhonchi,  CV: HRRR, no clubbing cyanosis or  peripheral edema nl cap refill  MS: moves all extremities without noticeable focal  abnormality  Lab Results  Component Value Date   WBC 5.8 07/16/2010   HGB 13.2 07/16/2010   HCT 39.3 07/16/2010   PLT 223.0 07/16/2010   GLUCOSE 80 05/26/2013   CHOL 198 05/26/2013   TRIG 40.0 05/26/2013   HDL 84.40 05/26/2013   LDLDIRECT 116.3 07/29/2011   LDLCALC 106* 05/26/2013   ALT 15 04/08/2012   AST 22 04/08/2012   NA 137 05/26/2013   K 3.7 05/26/2013   CL 100  05/26/2013   CREATININE 0.9 05/26/2013   BUN 16 05/26/2013   CO2 28 05/26/2013   TSH 2.55 05/26/2013    ASSESSMENT AND PLAN:  Discussed the following assessment and plan:  HTN, white coat - Plan: Basic metabolic panel, Hepatic function panel, Lipid panel, TSH  LVH (left ventricular hypertrophy) due to hypertensive disease - Plan: Basic metabolic panel, Hepatic function panel, Lipid panel, TSH  HYPERLIPIDEMIA NEC/NOS - Plan: Basic metabolic panel, Hepatic function panel, Lipid panel, TSH  Medication management  Statin intolerance Pt wants to tay on this dose of 1/2 40/12.5 as feels good on this  Agrees to get more data to ensure controlled  Had low bp when tried to increase dose . ( WC effect)  due for labs will do to day and plan rov in 4-6 months  Or as needed  -Patient advised to return or notify health care team  if symptoms worsen ,persist or new concerns arise.  Patient  Instructions  Please do BP readings twice a day for  One week a month and record  And bring in to visits .  Ok to take 1/2 pill at this  Labs today .  As you are due by July . If ok . and  BP  is good then plan  ROV in 4-6 months .   Standley Brooking. Erikah Thumm M.D.  Pre visit review using our clinic review tool, if applicable. No additional management support is needed unless otherwise documented below in the visit note. Total visit 9mins > 50% spent counseling and coordinating care

## 2014-04-05 ENCOUNTER — Telehealth: Payer: Self-pay | Admitting: Internal Medicine

## 2014-04-05 NOTE — Telephone Encounter (Signed)
Relevant patient education mailed to patient.  

## 2014-07-06 ENCOUNTER — Other Ambulatory Visit (HOSPITAL_COMMUNITY): Payer: Self-pay | Admitting: Cardiology

## 2014-07-06 DIAGNOSIS — I6529 Occlusion and stenosis of unspecified carotid artery: Secondary | ICD-10-CM

## 2014-07-11 ENCOUNTER — Ambulatory Visit (HOSPITAL_COMMUNITY): Payer: Medicare Other | Attending: Cardiology | Admitting: Cardiology

## 2014-07-11 DIAGNOSIS — E785 Hyperlipidemia, unspecified: Secondary | ICD-10-CM | POA: Insufficient documentation

## 2014-07-11 DIAGNOSIS — R0989 Other specified symptoms and signs involving the circulatory and respiratory systems: Secondary | ICD-10-CM | POA: Diagnosis not present

## 2014-07-11 DIAGNOSIS — I10 Essential (primary) hypertension: Secondary | ICD-10-CM | POA: Insufficient documentation

## 2014-07-11 DIAGNOSIS — I6529 Occlusion and stenosis of unspecified carotid artery: Secondary | ICD-10-CM | POA: Diagnosis present

## 2014-07-11 NOTE — Progress Notes (Signed)
Carotid duplex performed 

## 2014-09-04 ENCOUNTER — Encounter: Payer: Self-pay | Admitting: Internal Medicine

## 2014-10-04 ENCOUNTER — Encounter: Payer: Medicare Other | Admitting: Internal Medicine

## 2014-10-04 ENCOUNTER — Encounter: Payer: Self-pay | Admitting: Internal Medicine

## 2014-10-04 NOTE — Progress Notes (Signed)
Document opened and reviewed for OV but appt  NS same day .   

## 2014-11-01 ENCOUNTER — Ambulatory Visit: Payer: Medicare Other | Admitting: Family Medicine

## 2014-11-01 ENCOUNTER — Ambulatory Visit (INDEPENDENT_AMBULATORY_CARE_PROVIDER_SITE_OTHER): Payer: Medicare Other

## 2014-11-01 DIAGNOSIS — Z23 Encounter for immunization: Secondary | ICD-10-CM

## 2014-12-01 ENCOUNTER — Telehealth: Payer: Self-pay | Admitting: Internal Medicine

## 2014-12-01 NOTE — Telephone Encounter (Signed)
Pt ins no longer covers benicar and she would like to switch to telmisartan-hctz 40-12.5 #30 [facility]

## 2014-12-01 NOTE — Telephone Encounter (Signed)
Ok to switch as   Requested  1 po qd disp 90 days  No refills Due for OV 1-2 months.

## 2014-12-04 MED ORDER — TELMISARTAN-HCTZ 40-12.5 MG PO TABS: 1.0000 | ORAL_TABLET | Freq: Every day | ORAL | Status: DC

## 2014-12-04 NOTE — Telephone Encounter (Signed)
Sent to the pharmacy by e-scribe.  Will send this message to scheduling to help her make an appt in 1-2 months.

## 2014-12-04 NOTE — Telephone Encounter (Signed)
Pt has been sch

## 2014-12-04 NOTE — Telephone Encounter (Signed)
lmom for pt to cb

## 2015-02-07 ENCOUNTER — Encounter: Payer: Self-pay | Admitting: Internal Medicine

## 2015-02-07 ENCOUNTER — Ambulatory Visit (INDEPENDENT_AMBULATORY_CARE_PROVIDER_SITE_OTHER): Payer: Medicare Other | Admitting: Internal Medicine

## 2015-02-07 VITALS — BP 136/80 | Temp 97.9°F | Ht 62.0 in | Wt 128.4 lb

## 2015-02-07 DIAGNOSIS — I119 Hypertensive heart disease without heart failure: Secondary | ICD-10-CM | POA: Diagnosis not present

## 2015-02-07 DIAGNOSIS — H6122 Impacted cerumen, left ear: Secondary | ICD-10-CM

## 2015-02-07 DIAGNOSIS — Z889 Allergy status to unspecified drugs, medicaments and biological substances status: Secondary | ICD-10-CM

## 2015-02-07 DIAGNOSIS — Z789 Other specified health status: Secondary | ICD-10-CM

## 2015-02-07 DIAGNOSIS — I10 Essential (primary) hypertension: Secondary | ICD-10-CM

## 2015-02-07 DIAGNOSIS — IMO0001 Reserved for inherently not codable concepts without codable children: Secondary | ICD-10-CM

## 2015-02-07 DIAGNOSIS — E785 Hyperlipidemia, unspecified: Secondary | ICD-10-CM | POA: Diagnosis not present

## 2015-02-07 DIAGNOSIS — R030 Elevated blood-pressure reading, without diagnosis of hypertension: Secondary | ICD-10-CM | POA: Diagnosis not present

## 2015-02-07 LAB — BASIC METABOLIC PANEL
BUN: 20 mg/dL (ref 6–23)
CHLORIDE: 102 meq/L (ref 96–112)
CO2: 30 mEq/L (ref 19–32)
Calcium: 9.8 mg/dL (ref 8.4–10.5)
Creatinine, Ser: 0.94 mg/dL (ref 0.40–1.20)
GFR: 74.46 mL/min (ref >60.00)
Glucose, Bld: 98 mg/dL (ref 70–99)
POTASSIUM: 3.9 meq/L (ref 3.5–5.1)
Sodium: 138 mEq/L (ref 135–145)

## 2015-02-07 LAB — LIPID PANEL
CHOL/HDL RATIO: 3
Cholesterol: 223 mg/dL — ABNORMAL HIGH (ref 0–200)
HDL: 81.4 mg/dL (ref >39.00)
LDL CALC: 132 mg/dL — AB (ref 0–99)
NONHDL: 141.6
TRIGLYCERIDES: 49 mg/dL (ref 0.0–149.0)
VLDL: 9.8 mg/dL (ref 0.0–40.0)

## 2015-02-07 LAB — TSH: TSH: 3.05 u[IU]/mL (ref 0.35–4.50)

## 2015-02-07 NOTE — Progress Notes (Signed)
Chief Complaint  Patient presents with  . Follow-up    cant hear left ear  . Hypertension    HPI: DANYALE RIDINGER  74 y.o. comes in for ht evaluation after changing medication cause of insurance coverage change . She is now on  BP  129 so far.  As per pt denies cp sob syncope edema Does yard work  Husband has dementia .  Vision change in drops. Dec hearing left ear this am put drops in and  q tip no pain has samll ear canals  ROS: See pertinent positives and negatives per HPI.  Past Medical History  Diagnosis Date  . Hyperlipidemia   . Left bundle branch block   . Hypertension     lvh mild 2011  . Cardiomyopathy   . Vitamin D deficiency   . Glaucoma     dev vision left eye  . Varicella     Family History  Problem Relation Age of Onset  . Coronary artery disease Other   . Hypertension Other   . Kidney disease Other   . Heart disease Father     History   Social History  . Marital Status: Married    Spouse Name: N/A  . Number of Children: N/A  . Years of Education: N/A   Social History Main Topics  . Smoking status: Never Smoker   . Smokeless tobacco: Not on file  . Alcohol Use: No  . Drug Use: No  . Sexual Activity: Not on file   Other Topics Concern  . None   Social History Narrative   Retired   [employer] of 3   Married   Regular exercise- no   Grandchild with special needs      G3P3    Outpatient Encounter Prescriptions as of 02/07/2015  Medication Sig  . Cholecalciferol (VITAMIN D-3) 5000 UNITS TABS Take by mouth daily.    . niacin (NIASPAN) 1000 MG CR tablet Take 1 tablet (1,000 mg total) by mouth at bedtime.  Vladimir Faster Glycol-Propyl Glycol (SYSTANE) 0.4-0.3 % SOLN Apply to eye. Use as directed   . telmisartan-hydrochlorothiazide (MICARDIS HCT) 40-12.5 MG per tablet Take 1 tablet by mouth daily.  Marland Kitchen aspirin 81 MG tablet Take 81 mg by mouth daily.    . COD LIVER OIL PO Take by mouth.  . Magnesium 250 MG TABS Take by mouth daily.    .  [DISCONTINUED] travoprost, benzalkonium, (TRAVATAN) 0.004 % ophthalmic solution 1 drop at bedtime.      EXAM:  BP 136/80 mmHg  Temp(Src) 97.9 F (36.6 C) (Oral)  Ht 5\' 2"  (1.575 m)  Wt 128 lb 6.4 oz (58.242 kg)  BMI 23.48 kg/m2  Body mass index is 23.48 kg/(m^2).  GENERAL: vitals reviewed and listed above, alert, oriented, appears well hydrated and in no acute distress HEENT: atraumatic, conjunctiva  clear, no obvious abnormalities on inspection of external nose and ears  Left eac with large amount of  Brown waxOP : no lesion edema or exudate  NECK: no obvious masses on inspection palpation  LUNGS: clear to auscultation bilaterally, no wheezes, rales or rhonchi,CV: HRRR, no clubbing cyanosis minimal peripheral edema nl cap refill  MS: moves all extremities without noticeable focal  abnormality PSYCH: pleasant and cooperative, no obvious depression or anxiety  Future Appointments Date Time Provider [place]  08/09/2015 10:00 AM Burnis Medin, MD [facility]   bp 138/70 right 150 /70 left   ( patient says good left arm at home  Pulses nln ) ASSESSMENT AND PLAN:  Discussed the following assessment and plan:  Essential hypertension - Plan: Basic metabolic panel, Lipid panel, TSH  HTN, white coat - Plan: Basic metabolic panel, Lipid panel, TSH  LVH (left ventricular hypertrophy) due to hypertensive disease, without heart failure - no sx  last echo 2011 - Plan: Basic metabolic panel, Lipid panel, TSH  Hyperlipemia - check labs today NF - Plan: Basic metabolic panel, Lipid panel, TSH  Excess wax in ear, left Due for labs soon get today for patient convenience  No q tips in ears irrigation today  tmx better  Feels better  FU in  6 months or as indicated  Total visit 7mins > 50% spent counseling and coordinating care  -Patient advised to return or notify health care team  if symptoms worsen ,persist or new concerns arise.  Patient Instructions  dont put q tips  in ears . Will notify you  of labs when available.  Continue same medication for now  Fu dependin on labs   Or 6 months   Standley Brooking. Panosh M.D.

## 2015-02-07 NOTE — Patient Instructions (Signed)
dont put q tips in ears . Will notify you  of labs when available.  Continue same medication for now  Fu dependin on labs   Or 6 months

## 2015-03-09 ENCOUNTER — Other Ambulatory Visit: Payer: Self-pay | Admitting: Internal Medicine

## 2015-03-12 NOTE — Telephone Encounter (Signed)
Sent to the pharmacy by e-scribe.  Pt has upcoming appt on 08/09/15

## 2015-08-09 ENCOUNTER — Ambulatory Visit (INDEPENDENT_AMBULATORY_CARE_PROVIDER_SITE_OTHER): Payer: Medicare Other | Admitting: Internal Medicine

## 2015-08-09 ENCOUNTER — Encounter: Payer: Self-pay | Admitting: Internal Medicine

## 2015-08-09 VITALS — BP 160/82 | Temp 98.2°F | Wt 129.6 lb

## 2015-08-09 DIAGNOSIS — R030 Elevated blood-pressure reading, without diagnosis of hypertension: Secondary | ICD-10-CM | POA: Diagnosis not present

## 2015-08-09 DIAGNOSIS — IMO0001 Reserved for inherently not codable concepts without codable children: Secondary | ICD-10-CM

## 2015-08-09 DIAGNOSIS — I119 Hypertensive heart disease without heart failure: Secondary | ICD-10-CM

## 2015-08-09 DIAGNOSIS — Z23 Encounter for immunization: Secondary | ICD-10-CM

## 2015-08-09 NOTE — Progress Notes (Signed)
Pre visit review using our clinic review tool, if applicable. No additional management support is needed unless otherwise documented below in the visit note.  Chief Complaint  Patient presents with  . Follow-up    HPI: Kristina Davidson 74 y.o.  comes in for chronic disease/ medication management  Missing ocass bp med  Busy with care of husband yard work  And daughter at home with back pain    Missed a few doses  this week .   No cp sob syncope working yard digging up bushes .   No edema cough   Vision the same  Stable  Missing niacin at times  ROS: See pertinent positives and negatives per HPI.  Past Medical History  Diagnosis Date  . Hyperlipidemia   . Left bundle branch block   . Hypertension     lvh mild 2011  . Cardiomyopathy   . Vitamin D deficiency   . Glaucoma     dev vision left eye  . Varicella     Family History  Problem Relation Age of Onset  . Coronary artery disease Other   . Hypertension Other   . Kidney disease Other   . Heart disease Father     Social History   Social History  . Marital Status: Married    Spouse Name: N/A  . Number of Children: N/A  . Years of Education: N/A   Social History Main Topics  . Smoking status: Never Smoker   . Smokeless tobacco: None  . Alcohol Use: No  . Drug Use: No  . Sexual Activity: Not Asked   Other Topics Concern  . None   Social History Narrative   Retired   [employer] of 3   Married   Regular exercise- no   Grandchild with special needs      G3P3    Outpatient Prescriptions Prior to Visit  Medication Sig Dispense Refill  . aspirin 81 MG tablet Take 81 mg by mouth daily.      . Cholecalciferol (VITAMIN D-3) 5000 UNITS TABS Take by mouth daily.      . COD LIVER OIL PO Take by mouth.    . niacin (NIASPAN) 1000 MG CR tablet Take 1 tablet (1,000 mg total) by mouth at bedtime. 30 tablet 3  . Polyethyl Glycol-Propyl Glycol (SYSTANE) 0.4-0.3 % SOLN Apply to eye. Use as directed     .  telmisartan-hydrochlorothiazide (MICARDIS HCT) 40-12.5 MG per tablet TAKE 1 TABLET BY MOUTH EVERY DAY 90 tablet 1  . Magnesium 250 MG TABS Take by mouth daily.       No facility-administered medications prior to visit.     EXAM:  BP 160/82 mmHg  Temp(Src) 98.2 F (36.8 C)  Wt 129 lb 9.6 oz (58.786 kg)  Body mass index is 23.7 kg/(m^2).  GENERAL: vitals reviewed and listed above, alert, oriented, appears well hydrated and in no acute distress HEENT: atraumatic,NECK: no obvious masses on inspection palpation  LUNGS: clear to auscultation bilaterally, no wheezes, rales or rhonchi, good air movement CV: HRRR, no clubbing cyanosis 1+  peripheral edema nl cap refill  MS: moves all extremities without noticeable focal  abnormality PSYCH: pleasant and cooperative, no obvious depression or anxiety Lab Results  Component Value Date   WBC 5.8 07/16/2010   HGB 13.2 07/16/2010   HCT 39.3 07/16/2010   PLT 223.0 07/16/2010   GLUCOSE 98 02/07/2015   CHOL 223* 02/07/2015   TRIG 49.0 02/07/2015   HDL 81.40 02/07/2015  LDLDIRECT 116.3 07/29/2011   LDLCALC 132* 02/07/2015   ALT 16 04/04/2014   AST 24 04/04/2014   NA 138 02/07/2015   K 3.9 02/07/2015   CL 102 02/07/2015   CREATININE 0.94 02/07/2015   BUN 20 02/07/2015   CO2 30 02/07/2015   TSH 3.05 02/07/2015   BP Readings from Last 3 Encounters:  08/09/15 160/82  02/07/15 136/80  04/04/14 138/70   BP Readings from Last 3 Encounters:  08/09/15 160/82  02/07/15 136/80  04/04/14 138/70     ASSESSMENT AND PLAN:  Discussed the following assessment and plan:  Hypertensive heart disease without heart failure - had been better last visit missed dose? wc effect has had monitor checked at home bvefore and has been good   HTN, white coat - hx documented by home monitor in past  reassess  Need for prophylactic vaccination and inoculation against influenza - Plan: Flu vaccine HIGH DOSE PF (Fluzone High dose)  Need for vaccination  with 13-polyvalent pneumococcal conjugate vaccine - Plan: Pneumococcal conjugate vaccine 13-valent  LVH (left ventricular hypertrophy) due to hypertensive disease, without heart failure Stress at home  Physically active    Expectant management. And fu if elevaetd bp at home call and we can adjsut med dose over phone  She did great on benicar  hctz  In past  Plan cpx and labs in 6 months   Or as needed above  -Patient advised to return or notify health care team  if symptoms worsen ,persist or new concerns arise.  Patient Instructions    Your blood pressure is up today   Make sure take   Med every day .   Check bp for a few days and make sure  below 140/90    We may need to adjust .  Medication .  If still elevated .  Contact us if elevated  at home   BP today 160/82 and 160/100   BP Readings from Last 3 Encounters:  02/07/15 136/80  04/04/14 138/70  01/19/14 168/80   Plan  Wellness  visit and full panel of lab work in 6 months      [place]. Kairav Russomanno M.D.

## 2015-08-09 NOTE — Patient Instructions (Signed)
  Your blood pressure is up today   Make sure take   Med every day .   Check bp for a few days and make sure  below 140/90    We may need to adjust .  Medication .  If still elevated .  Contact us if elevated  at home   BP today 160/82 and 160/100   BP Readings from Last 3 Encounters:  02/07/15 136/80  04/04/14 138/70  01/19/14 168/80   Plan  Wellness  visit and full panel of lab work in 6 months

## 2015-10-16 ENCOUNTER — Other Ambulatory Visit: Payer: Self-pay | Admitting: Internal Medicine

## 2015-10-16 NOTE — Telephone Encounter (Signed)
Sent to the pharmacy by e-scribe.  Pt has upcoming appt 02/2016.

## 2016-02-05 ENCOUNTER — Other Ambulatory Visit: Payer: Self-pay

## 2016-02-12 ENCOUNTER — Encounter: Payer: Self-pay | Admitting: Internal Medicine

## 2016-03-17 NOTE — Progress Notes (Signed)
Chief Complaint  Patient presents with  . Annual Exam  . Insect Bite    tick bite right ankle x 2 days ago    HPI: Kristina Davidson 75 y.o.  Couple of issues  Didn't thinkshe should have a wellness concern it wont be paid for   BP Taking  Med  ocass miss : 2 x per week.  Work [employer]  Lives at home with husband and daughter   Has to help  Husbands  Med and forget sher at times    No cp osb syncope  No change in exercise tolerance    Had tick biten past few dayus  Is iriitated itchy ? Please check no rash other sx   Taking medication without problem      Eyes stable no change  Had epsod e of stomach  Problem  Lower mid chest pain when she ate too fast   happened once  And no assoc sx   .  Hands  Fingers  right ring large and now lft  Hard to get ring off  Stiff and hurt at times ROS: See pertinent positives and negatives per HPI.  Past Medical History  Diagnosis Date  . Hyperlipidemia   . Left bundle branch block   . Hypertension     lvh mild 2011  . Cardiomyopathy   . Vitamin D deficiency   . Glaucoma     dev vision left eye  . Varicella     Family History  Problem Relation Age of Onset  . Coronary artery disease Other   . Hypertension Other   . Kidney disease Other   . Heart disease Father     Social History   Social History  . Marital Status: Married    Spouse Name: N/A  . Number of Children: N/A  . Years of Education: N/A   Social History Main Topics  . Smoking status: Never Smoker   . Smokeless tobacco: None  . Alcohol Use: No  . Drug Use: No  . Sexual Activity: Not Asked   Other Topics Concern  . None   Social History Narrative   Retired   [employer] of 3   Married   Regular exercise- no   Grandchild with special needs      G3P3    Outpatient Prescriptions Prior to Visit  Medication Sig Dispense Refill  . Polyethyl Glycol-Propyl Glycol (SYSTANE) 0.4-0.3 % SOLN Apply to eye. Use as directed     . telmisartan-hydrochlorothiazide (MICARDIS  HCT) 40-12.5 MG tablet TAKE 1 TABLET BY MOUTH EVERY DAY 90 tablet 1  . aspirin 81 MG tablet Take 81 mg by mouth daily. Reported on 03/18/2016    . Cholecalciferol (VITAMIN D-3) 5000 UNITS TABS Take by mouth daily. Reported on 03/18/2016    . COD LIVER OIL PO Take by mouth. Reported on 03/18/2016    . Magnesium 250 MG TABS Take by mouth daily. Reported on 03/18/2016    . niacin (NIASPAN) 1000 MG CR tablet Take 1 tablet (1,000 mg total) by mouth at bedtime. (Patient not taking: Reported on 03/18/2016) 30 tablet 3   No facility-administered medications prior to visit.     EXAM:  BP 140/84 mmHg  Pulse 67  Temp(Src) 98.2 F (36.8 C) (Oral)  Ht 5\' 2"  (1.575 m)  Wt 122 lb (55.339 kg)  BMI 22.31 kg/m2  SpO2 98%  Body mass index is 22.31 kg/(m^2).  GENERAL: vitals reviewed and listed above, alert, oriented, appears well  hydrated and in no acute distress HEENT: atraumatic, conjunctiva slight ly red , no obvious abnormalities on inspection of external nose and ears OP : no lesion edema or exudate  NECK: no obvious masses on inspection palpation  Right bruit carotid laying hard to hear upright no inc jvd sitting LUNGS: clear to auscultation bilaterally, no wheezes, rales or rhonchi, good air movement CV: HRRR, no clubbing cyanosis or  peripheral edema nl cap refill  MS: moves all extremities without noticeable focal  Abnormality skin right lateral leg with bite and mild redenss surrounding this area  No vesicle or streaking  Hands left ring finger  Some no redness swlling at dip  Slight dec in extension    No click  PSYCH: pleasant and cooperative mildly anxious and pleasant Lab Results  Component Value Date   WBC 5.8 07/16/2010   HGB 13.2 07/16/2010   HCT 39.3 07/16/2010   PLT 223.0 07/16/2010   GLUCOSE 85 03/18/2016   CHOL 206* 03/18/2016   TRIG 52.0 03/18/2016   HDL 69.40 03/18/2016   LDLDIRECT 116.3 07/29/2011   LDLCALC 126* 03/18/2016   ALT 16 04/04/2014   AST 24 04/04/2014   NA 143  03/18/2016   K 4.0 03/18/2016   CL 106 03/18/2016   CREATININE 0.91 03/18/2016   BUN 21 03/18/2016   CO2 29 03/18/2016   TSH 1.34 03/18/2016    ASSESSMENT AND PLAN:  Discussed the following assessment and plan:  Hypertensive heart disease without heart failure - Plan: Basic metabolic panel, TSH, Lipid panel  Medication management - Plan: Basic metabolic panel, TSH, Lipid panel  Hyperlipidemia - Plan: Basic metabolic panel, TSH, Lipid panel  Essential hypertension - Plan: Basic metabolic panel, TSH, Lipid panel  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  disc options consider hand eval if increasing issues righ hand eva in past and no helpshe says  Statin intolerance  Carotid artery disease, unspecified laterality ([place]) Labs and if ok then yearly  Bp had been controlled and in past when increase dose of med got hypotensive at home   .  -Patient advised to return or notify health care team  if symptoms worsen ,persist or new concerns arise.  Patient Instructions   Take  medication every day try a pill box. Same medication ok . If BP controlled   considier getting  Colon cancer screening   Fu depending on results  Or  1 year  If doing ok .  If getting  stomach sx or other   Let us know.  Or return.   Wt Readings from Last 3 Encounters:  03/18/16 122 lb (55.339 kg)  08/09/15 129 lb 9.6 oz (58.786 kg)  02/07/15 128 lb 6.4 oz (58.242 kg)   Watch tick bite area   And .  Let us know if fever or progressive rash bh but should resolve on its own  Consider    Wellness visit with our nurse coach  Has appts on [place].   Consider trying topical  antinfalmmaotry gel .   For the arthritis.   Can ask pharmacist if there is other equivalents that are not pills .  If finger gets contracted  Then contact us for   Other evaluation. somtimet injections can help  .  If all ok then  Yearly visit in a year         Evaristo Tsuda K. Halena Mohar M.D. Record review  after patient left   10 minutes    Last doppler carotids  9 15 had 60 - 79 % left ica advised 6 months fu . Right stable at 0-39

## 2016-03-18 ENCOUNTER — Encounter: Payer: Self-pay | Admitting: Internal Medicine

## 2016-03-18 ENCOUNTER — Ambulatory Visit (INDEPENDENT_AMBULATORY_CARE_PROVIDER_SITE_OTHER): Payer: Medicare Other | Admitting: Internal Medicine

## 2016-03-18 VITALS — BP 140/84 | HR 67 | Temp 98.2°F | Ht 62.0 in | Wt 122.0 lb

## 2016-03-18 DIAGNOSIS — Z79899 Other long term (current) drug therapy: Secondary | ICD-10-CM

## 2016-03-18 DIAGNOSIS — Z889 Allergy status to unspecified drugs, medicaments and biological substances status: Secondary | ICD-10-CM

## 2016-03-18 DIAGNOSIS — Z789 Other specified health status: Secondary | ICD-10-CM

## 2016-03-18 DIAGNOSIS — M1990 Unspecified osteoarthritis, unspecified site: Secondary | ICD-10-CM

## 2016-03-18 DIAGNOSIS — I10 Essential (primary) hypertension: Secondary | ICD-10-CM | POA: Diagnosis not present

## 2016-03-18 DIAGNOSIS — I739 Peripheral vascular disease, unspecified: Secondary | ICD-10-CM

## 2016-03-18 DIAGNOSIS — E785 Hyperlipidemia, unspecified: Secondary | ICD-10-CM

## 2016-03-18 DIAGNOSIS — I119 Hypertensive heart disease without heart failure: Secondary | ICD-10-CM | POA: Diagnosis not present

## 2016-03-18 DIAGNOSIS — I779 Disorder of arteries and arterioles, unspecified: Secondary | ICD-10-CM

## 2016-03-18 LAB — BASIC METABOLIC PANEL
BUN: 21 mg/dL (ref 6–23)
CALCIUM: 9.7 mg/dL (ref 8.4–10.5)
CO2: 29 meq/L (ref 19–32)
Chloride: 106 mEq/L (ref 96–112)
Creatinine, Ser: 0.91 mg/dL (ref 0.40–1.20)
GFR: 77.08 mL/min (ref >60.00)
GLUCOSE: 85 mg/dL (ref 70–99)
POTASSIUM: 4 meq/L (ref 3.5–5.1)
SODIUM: 143 meq/L (ref 135–145)

## 2016-03-18 LAB — LIPID PANEL
CHOL/HDL RATIO: 3
Cholesterol: 206 mg/dL — ABNORMAL HIGH (ref 0–200)
HDL: 69.4 mg/dL (ref >39.00)
LDL CALC: 126 mg/dL — AB (ref 0–99)
NONHDL: 136.36
TRIGLYCERIDES: 52 mg/dL (ref 0.0–149.0)
VLDL: 10.4 mg/dL (ref 0.0–40.0)

## 2016-03-18 LAB — TSH: TSH: 1.34 u[IU]/mL (ref 0.35–4.50)

## 2016-03-18 MED ORDER — DICLOFENAC SODIUM 1 % TD GEL: 2.0000 g | Freq: Four times a day (QID) | TRANSDERMAL | Status: DC

## 2016-03-18 NOTE — Patient Instructions (Addendum)
Take  medication every day try a pill box. Same medication ok . If BP controlled   considier getting  Colon cancer screening   Fu depending on results  Or  1 year  If doing ok .  If getting  stomach sx or other   Let us know.  Or return.   Wt Readings from Last 3 Encounters:  03/18/16 122 lb (55.339 kg)  08/09/15 129 lb 9.6 oz (58.786 kg)  02/07/15 128 lb 6.4 oz (58.242 kg)   Watch tick bite area   And .  Let us know if fever or progressive rash bh but should resolve on its own  Consider    Wellness visit with our nurse coach  Has appts on [place].   Consider trying topical  antinfalmmaotry gel .   For the arthritis.   Can ask pharmacist if there is other equivalents that are not pills .  If finger gets contracted  Then contact us for   Other evaluation. somtimet injections can help  .  If all ok then  Yearly visit in a year

## 2016-03-20 NOTE — Assessment & Plan Note (Signed)
Last  Doppler    2015 no fu in record advised to repeat will contact pat about this  Is statin itolerant taking asa

## 2016-05-31 ENCOUNTER — Other Ambulatory Visit: Payer: Self-pay | Admitting: Internal Medicine

## 2016-06-03 NOTE — Telephone Encounter (Signed)
Sent to the pharmacy by e-scribe.  Pt has upcoming cpx on 03/17/17.

## 2016-07-29 ENCOUNTER — Ambulatory Visit (INDEPENDENT_AMBULATORY_CARE_PROVIDER_SITE_OTHER): Payer: Medicare Other | Admitting: Family Medicine

## 2016-07-29 DIAGNOSIS — Z23 Encounter for immunization: Secondary | ICD-10-CM | POA: Diagnosis not present

## 2017-02-19 ENCOUNTER — Telehealth: Payer: Self-pay

## 2017-02-19 NOTE — Telephone Encounter (Signed)
Received PA request from [facility] for Telmisartan-HCTZ 40-12.5 mg. PA submitted & is pending. Key:  [id number]

## 2017-02-20 ENCOUNTER — Other Ambulatory Visit: Payer: Self-pay | Admitting: Emergency Medicine

## 2017-02-20 NOTE — Telephone Encounter (Signed)
Pt states that she does have some medication left to control her BP (Telmisartan) and she is going to give her insurance a call. Also has an appointment set up for may

## 2017-02-20 NOTE — Telephone Encounter (Signed)
FYI

## 2017-02-20 NOTE — Telephone Encounter (Signed)
She needs to be on  A tolerated effective med for her Hypertension.  She can call her insurance   About this .  Also we can discuss at [facility]   But she needs to make sure her BP is controlled

## 2017-02-20 NOTE — Telephone Encounter (Signed)
Inform patient  Of  PBMs  Rules for authortization    Thus switch to similar medicine   Still generic  Stop the  telmisartan hctz and   change to valsartan  160/12.5    1 po qd disp 30 refill x 3  And  Make sure had yearly visit with me  In  May June   And can do med check then also

## 2017-02-20 NOTE — Telephone Encounter (Signed)
PA denied. Two alternative formulary medications in the same drug class or category have not been tried.  Alternatives: Irbesartan-HCTZ Losartan-HCTZ Valsartan-HCTZ

## 2017-02-20 NOTE — Telephone Encounter (Signed)
Sounds like a good plan

## 2017-02-20 NOTE — Telephone Encounter (Signed)
Left a voicemail for pt to give the office a call back in regards to PA being denied for Telmisartan and trying Valsartan instead. Would like to know if it is okay to send in.

## 2017-02-20 NOTE — Telephone Encounter (Signed)
Pt states she does not want to try any other medication other than the telmisartan. Pt is aware that the PA was denied for Telmisarta.

## 2017-03-09 ENCOUNTER — Telehealth: Payer: Self-pay | Admitting: Internal Medicine

## 2017-03-09 NOTE — Telephone Encounter (Signed)
Called to see if pt wanted to schedule awv - left message. ( coming in 5/15 - come in at 9? )

## 2017-03-16 NOTE — Progress Notes (Signed)
Chief Complaint  Patient presents with  . Annual Exam    HPI: Kristina Davidson 76 y.o. comes in today for Preventive Medicare exam and Chronic disease management HT heart disease Bp readings   Higher in office    Getting  129/80+  At home  On micardis hctz and  Insurance denied hjas had to try 2 of the 3 others I=on list  Says she has in past losartan  vasartan and ?iibisartan hctz  Insurance  Formulary limitations  She says has been on some of these and not as good or se  joint acnes or not working  EYES  To have surgery right eye [facility] next week.  caretaking dementia father  Daughter helps out at home  . allergies  Stable  LIPIDS taking niacin cannot tolerated statin uncertain if  Ever tried zetia    Health Maintenance  Topic Date Due  . DEXA SCAN  02/21/2004  . TETANUS/TDAP  11/03/2013  . INFLUENZA VACCINE  06/03/2017  . PNA vac Low Risk Adult  Completed   Health Maintenance Review LIFESTYLE:  TAD no  Sugar beverages:   ocass   Sweet   Sleep:not much  Husband dementia     hh of 3 no pets     Hearing: ok  Vision:  swee specialist  glucoma   Safety:  Has smoke detector  Needs to check batteries  and wears seat belts.  No firearms. No excess sun exposure. Dentist in past  [facility] not recently time and cost  Falls: n  Memory: Felt to be good  , no concern from her or her family.  Depression: No anhedonia unusual crying or depressive symptoms  Nutrition: Eats well balanced diet; adequate calcium and vitamin D. No swallowing chewing problems.  Injury: no major injuries in the last six months.  Other healthcare providers:  Reviewed today .  Social:  Lives with spouse married and daughte r. .   Preventive parameters: up-to-date  Reviewed  Declined mammo cause of  Pain  Nl sx   ADLS:   There are no problems or need for assistance  driving, feeding, obtaining food, dressing, toileting and bathing, managing money using phone. She is independent. EXERCISE/ HABITS   Mows  yard does the gardening  Allergy uses mask   ROS:   voltaren didn't help with joint pains  GEN/ HEENT: No fever, significant weight changes sweats headaches vision problems hearing changes, CV/ PULM; No chest pain shortness of breath cough, syncope,edema  change in exercise tolerance. GI /GU: No adominal pain, vomiting, change in bowel habits. No blood in the stool. No significant GU symptoms. SKIN/HEME: ,no acute skin rashes suspicious lesions or bleeding. No lymphadenopathy, nodules, masses.  NEURO/ PSYCH:  No neurologic signs such as weakness numbness. No depression anxiety. IMM/ Allergy: No unusual infections.  Allergy .   REST of 12 system review negative except as per HPI   Past Medical History:  Diagnosis Date  . Cardiomyopathy   . Glaucoma    dev vision left eye  . Hyperlipidemia   . Hypertension    lvh mild 2011  . Left bundle branch block   . Varicella   . Vitamin D deficiency     Family History  Problem Relation Age of Onset  . Heart disease Father   . Coronary artery disease Other   . Hypertension Other   . Kidney disease Other     Social History   Social History  . Marital status: Married  Spouse name: N/A  . Number of children: N/A  . Years of education: N/A   Social History Main Topics  . Smoking status: Never Smoker  . Smokeless tobacco: Never Used  . Alcohol use No  . Drug use: No  . Sexual activity: Not Asked   Other Topics Concern  . None   Social History Narrative   Retired   [employer] of 3   Married   Regular exercise- no   Grandchild with special needs      G3P3    Outpatient Encounter Prescriptions as of 03/17/2017  Medication Sig  . latanoprost (XALATAN) 0.005 % ophthalmic solution Place 1 drop into both eyes at bedtime as needed.  . niacin (NIASPAN) 1000 MG CR tablet Take 1 tablet (1,000 mg total) by mouth at bedtime.  Vladimir Faster Glycol-Propyl Glycol (SYSTANE) 0.4-0.3 % SOLN Apply to eye. Use as directed   .  telmisartan-hydrochlorothiazide (MICARDIS HCT) 40-12.5 MG tablet TAKE 1 TABLET BY MOUTH EVERY DAY  . Magnesium 250 MG TABS Take by mouth daily. Reported on 03/18/2016  . [DISCONTINUED] aspirin 81 MG tablet Take 81 mg by mouth daily. Reported on 03/18/2016  . [DISCONTINUED] Cholecalciferol (VITAMIN D-3) 5000 UNITS TABS Take by mouth daily. Reported on 03/18/2016  . [DISCONTINUED] COD LIVER OIL PO Take by mouth. Reported on 03/18/2016  . [DISCONTINUED] diclofenac sodium (VOLTAREN) 1 % GEL Apply 2 g topically 4 (four) times daily. As needed for hand arthritis (Patient not taking: Reported on 03/17/2017)   No facility-administered encounter medications on file as of 03/17/2017.     EXAM:  BP (!) 150/90 (BP Location: Right Arm, Patient Position: Sitting, Cuff Size: Normal)   Pulse 63   Temp 98 F (36.7 C) (Oral)   Ht 5' 2.5" (1.588 m)   Wt 129 lb 9.6 oz (58.8 kg)   BMI 23.33 kg/m   Body mass index is 23.33 kg/m.  Physical Exam: Vital signs reviewed GLO:VFIE is a well-developed well-nourished alert cooperative   who appears stated age in no acute distress.  HEENT: normocephalic atraumatic , Eyes:  conjunctiva clear, Nares: paten,t no deformity discharge or tenderness., Ears: no deformity EAC's clear TMs with normal landmarks. Mouth: clear OP, no lesions, edema.  Moist mucous membranes. Dentition in adequate repair. Lower bridge  NECK: supple without masses, thyromegaly or bruits. CHEST/PULM:  Clear to auscultation and percussion breath sounds equal no wheeze , rales or rhonchi. No chest wall deformities or tenderness.Breast: normal by inspection . No dimpling, discharge, masses, tenderness or discharge . CV: PMI is nondisplaced, S1 S2 no gallops, murmurs, rubs. Peripheral pulses are full without delay.No JVD .  ? Mid syst click supine only  ABDOMEN: Bowel sounds normal nontender  No guard or rebound, no hepato splenomegal no CVA tenderness.   Extremtities:  No clubbing cyanosis or edema,  VV  noted in legs  Slight edema no acute joint swelling or redness no focal atrophy hsa djd deformity in hand nl rocm no redness or warmth  NEURO:  Oriented x3, cranial nerves 3-12 appear to be intact, no obvious focal weakness,gait within normal limits no abnormal reflexes or asymmetrical SKIN: No acute rashes normal turgor, color, no bruising or petechiae. PSYCH: Oriented, good eye contact, no obvious depression anxiety, cognition and judgment appear normal. LN: no cervical axillary inguinal adenopathy No noted deficits in memory, attention, and speech.   Lab Results  Component Value Date   WBC 5.8 07/16/2010   HGB 13.2 07/16/2010   HCT 39.3 07/16/2010  PLT 223.0 07/16/2010   GLUCOSE 80 03/17/2017   CHOL 227 (H) 03/17/2017   TRIG 53.0 03/17/2017   HDL 82.80 03/17/2017   LDLDIRECT 116.3 07/29/2011   LDLCALC 133 (H) 03/17/2017   ALT 10 03/17/2017   AST 18 03/17/2017   NA 141 03/17/2017   K 3.9 03/17/2017   CL 104 03/17/2017   CREATININE 0.93 03/17/2017   BUN 14 03/17/2017   CO2 33 (H) 03/17/2017   TSH 1.34 03/18/2016   BP Readings from Last 3 Encounters:  03/17/17 (!) 150/90  03/18/16 140/84  08/09/15 (!) 160/82   Wt Readings from Last 3 Encounters:  03/17/17 129 lb 9.6 oz (58.8 kg)  03/18/16 122 lb (55.3 kg)  08/09/15 129 lb 9.6 oz (58.8 kg)     ASSESSMENT AND PLAN:  Discussed the following assessment and plan:  Visit for preventive health examination  Hyperlipidemia, unspecified hyperlipidemia type - Plan: Basic metabolic panel, Lipid panel, Hepatic function panel  Essential hypertension - Plan: Basic metabolic panel, Lipid panel  Medication management - Plan: Basic metabolic panel, Lipid panel, Hepatic function panel  Statin intolerance  Hypertensive heart disease without heart failure - Plan: Basic metabolic panel, Lipid panel  Glaucoma of both eyes, unspecified glaucoma type Due bmp and lipids   Declining   mammor gram fro discomfort   Is sensitive to  many meds and has  WC effect   It is  injer best medical interest for her to stay on  Current medication  medication again . Has used  Losartan and valsartan  Products in the past .  And not effective or had se, cough joint pains   Review record after patient left. She never followed up with cardiology about stress test in   And after re referral  but has had no acute cardiovascular symptoms or signs of heart failure since that time. We'll follow her exam consider repeat echo she agrees. She is very hesitant to do more workup that would cost effectiveness for her. However would like to get her blood pressure and optimal control. She is aware. Patient Care Team: Panosh, Standley Brooking, MD as PCP - General Stanford Breed, Denice Bors, MD (Cardiology) Verneita Griffes, MD (Ophthalmology)  Patient Instructions  We'll try to put through prior authorization of heel to keep you on your same blood pressure medicine since you have tried the previous medicines before and have great concerns about side effects. Advise blood pressure readings should be below 140 preferably 120/80. Let you know blood results when available. Plan follow-up visit depending on blood pressure results and how you were doing. Giving you a prescription for the new shingles vaccine check with your insurance about best way to receive this for coverage. Health Maintenance, Female Adopting a healthy lifestyle and getting preventive care can go a long way to promote health and wellness. Talk with your health care provider about what schedule of regular examinations is right for you. This is a good chance for you to check in with your provider about disease prevention and staying healthy. In between checkups, there are plenty of things you can do on your own. Experts have done a lot of research about which lifestyle changes and preventive measures are most likely to keep you healthy. Ask your health care provider for more information. Weight and diet Eat a  healthy diet  Be sure to include plenty of vegetables, fruits, low-fat dairy products, and lean protein.  Do not eat a lot of foods high in solid  fats, added sugars, or salt.  Get regular exercise. This is one of the most important things you can do for your health.  Most adults should exercise for at least 150 minutes each week. The exercise should increase your heart rate and make you sweat (moderate-intensity exercise).  Most adults should also do strengthening exercises at least twice a week. This is in addition to the moderate-intensity exercise. Maintain a healthy weight  Body mass index (BMI) is a measurement that can be used to identify possible weight problems. It estimates body fat based on height and weight. Your health care provider can help determine your BMI and help you achieve or maintain a healthy weight.  For females 69 years of age and older:  A BMI below 18.5 is considered underweight.  A BMI of 18.5 to 24.9 is normal.  A BMI of 25 to 29.9 is considered overweight.  A BMI of 30 and above is considered obese. Watch levels of cholesterol and blood lipids  You should start having your blood tested for lipids and cholesterol at 78 years of age, then have this test every 5 years.  You may need to have your cholesterol levels checked more often if:  Your lipid or cholesterol levels are high.  You are older than 78 years of age.  You are at high risk for heart disease. Cancer screening Lung Cancer  Lung cancer screening is recommended for adults 79-13 years old who are at high risk for lung cancer because of a history of smoking.  A yearly low-dose CT scan of the lungs is recommended for people who:  Currently smoke.  Have quit within the past 15 years.  Have at least a 30-pack-year history of smoking. A pack year is smoking an average of one pack of cigarettes a day for 1 year.  Yearly screening should continue until it has been 15 years since you  quit.  Yearly screening should stop if you develop a health problem that would prevent you from having lung cancer treatment. Breast Cancer  Practice breast self-awareness. This means understanding how your breasts normally appear and feel.  It also means doing regular breast self-exams. Let your health care provider know about any changes, no matter how small.  If you are in your 20s or 30s, you should have a clinical breast exam (CBE) by a health care provider every 1-3 years as part of a regular health exam.  If you are 83 or older, have a CBE every year. Also consider having a breast X-ray (mammogram) every year.  If you have a family history of breast cancer, talk to your health care provider about genetic screening.  If you are at high risk for breast cancer, talk to your health care provider about having an MRI and a mammogram every year.  Breast cancer gene (BRCA) assessment is recommended for women who have family members with BRCA-related cancers. BRCA-related cancers include:  Breast.  Ovarian.  Tubal.  Peritoneal cancers.  Results of the assessment will determine the need for genetic counseling and BRCA1 and BRCA2 testing. Cervical Cancer  Your health care provider may recommend that you be screened regularly for cancer of the pelvic organs (ovaries, uterus, and vagina). This screening involves a pelvic examination, including checking for microscopic changes to the surface of your cervix (Pap test). You may be encouraged to have this screening done every 3 years, beginning at age 36.  For women ages 43-65, health care providers may recommend pelvic exams and  Pap testing every 3 years, or they may recommend the Pap and pelvic exam, combined with testing for human papilloma virus (HPV), every 5 years. Some types of HPV increase your risk of cervical cancer. Testing for HPV may also be done on women of any age with unclear Pap test results.  Other health care providers may  not recommend any screening for nonpregnant women who are considered low risk for pelvic cancer and who do not have symptoms. Ask your health care provider if a screening pelvic exam is right for you.  If you have had past treatment for cervical cancer or a condition that could lead to cancer, you need Pap tests and screening for cancer for at least 20 years after your treatment. If Pap tests have been discontinued, your risk factors (such as having a new sexual partner) need to be reassessed to determine if screening should resume. Some women have medical problems that increase the chance of getting cervical cancer. In these cases, your health care provider may recommend more frequent screening and Pap tests. Colorectal Cancer  This type of cancer can be detected and often prevented.  Routine colorectal cancer screening usually begins at 78 years of age and continues through 78 years of age.  Your health care provider may recommend screening at an earlier age if you have risk factors for colon cancer.  Your health care provider may also recommend using home test kits to check for hidden blood in the stool.  A small camera at the end of a tube can be used to examine your colon directly (sigmoidoscopy or colonoscopy). This is done to check for the earliest forms of colorectal cancer.  Routine screening usually begins at age 70.  Direct examination of the colon should be repeated every 5-10 years through 78 years of age. However, you may need to be screened more often if early forms of precancerous polyps or small growths are found. Skin Cancer  Check your skin from head to toe regularly.  Tell your health care provider about any new moles or changes in moles, especially if there is a change in a mole's shape or color.  Also tell your health care provider if you have a mole that is larger than the size of a pencil eraser.  Always use sunscreen. Apply sunscreen liberally and repeatedly  throughout the day.  Protect yourself by wearing long sleeves, pants, a wide-brimmed hat, and sunglasses whenever you are outside. Heart disease, diabetes, and high blood pressure  High blood pressure causes heart disease and increases the risk of stroke. High blood pressure is more likely to develop in:  People who have blood pressure in the high end of the normal range (130-139/85-89 mm Hg).  People who are overweight or obese.  People who are African American.  If you are 23-63 years of age, have your blood pressure checked every 3-5 years. If you are 21 years of age or older, have your blood pressure checked every year. You should have your blood pressure measured twice-once when you are at a hospital or clinic, and once when you are not at a hospital or clinic. Record the average of the two measurements. To check your blood pressure when you are not at a hospital or clinic, you can use:  An automated blood pressure machine at a pharmacy.  A home blood pressure monitor.  If you are between 62 years and 27 years old, ask your health care provider if you should take aspirin to  prevent strokes.  Have regular diabetes screenings. This involves taking a blood sample to check your fasting blood sugar level.  If you are at a normal weight and have a low risk for diabetes, have this test once every three years after 78 years of age.  If you are overweight and have a high risk for diabetes, consider being tested at a younger age or more often. Preventing infection Hepatitis B  If you have a higher risk for hepatitis B, you should be screened for this virus. You are considered at high risk for hepatitis B if:  You were born in a country where hepatitis B is common. Ask your health care provider which countries are considered high risk.  Your parents were born in a high-risk country, and you have not been immunized against hepatitis B (hepatitis B vaccine).  You have HIV or AIDS.  You  use needles to inject street drugs.  You live with someone who has hepatitis B.  You have had sex with someone who has hepatitis B.  You get hemodialysis treatment.  You take certain medicines for conditions, including cancer, organ transplantation, and autoimmune conditions. Hepatitis C  Blood testing is recommended for:  Everyone born from [birth year] through 1967.  Anyone with known risk factors for hepatitis C. Sexually transmitted infections (STIs)  You should be screened for sexually transmitted infections (STIs) including gonorrhea and chlamydia if:  You are sexually active and are younger than 78 years of age.  You are older than 78 years of age and your health care provider tells you that you are at risk for this type of infection.  Your sexual activity has changed since you were last screened and you are at an increased risk for chlamydia or gonorrhea. Ask your health care provider if you are at risk.  If you do not have HIV, but are at risk, it may be recommended that you take a prescription medicine daily to prevent HIV infection. This is called pre-exposure prophylaxis (PrEP). You are considered at risk if:  You are sexually active and do not regularly use condoms or know the HIV status of your partner(s).  You take drugs by injection.  You are sexually active with a partner who has HIV. Talk with your health care provider about whether you are at high risk of being infected with HIV. If you choose to begin PrEP, you should first be tested for HIV. You should then be tested every 3 months for as long as you are taking PrEP. Pregnancy  If you are premenopausal and you may become pregnant, ask your health care provider about preconception counseling.  If you may become pregnant, take 400 to 800 micrograms (mcg) of folic acid every day.  If you want to prevent pregnancy, talk to your health care provider about birth control (contraception). Osteoporosis and  menopause  Osteoporosis is a disease in which the bones lose minerals and strength with aging. This can result in serious bone fractures. Your risk for osteoporosis can be identified using a bone density scan.  If you are 94 years of age or older, or if you are at risk for osteoporosis and fractures, ask your health care provider if you should be screened.  Ask your health care provider whether you should take a calcium or vitamin D supplement to lower your risk for osteoporosis.  Menopause may have certain physical symptoms and risks.  Hormone replacement therapy may reduce some of these symptoms and risks. Talk to your  health care provider about whether hormone replacement therapy is right for you. Follow these instructions at home:  Schedule regular health, dental, and eye exams.  Stay current with your immunizations.  Do not use any tobacco products including cigarettes, chewing tobacco, or electronic cigarettes.  If you are pregnant, do not drink alcohol.  If you are breastfeeding, limit how much and how often you drink alcohol.  Limit alcohol intake to no more than 1 drink per day for nonpregnant women. One drink equals 12 ounces of beer, 5 ounces of wine, or 1 ounces of hard liquor.  Do not use street drugs.  Do not share needles.  Ask your health care provider for help if you need support or information about quitting drugs.  Tell your health care provider if you often feel depressed.  Tell your health care provider if you have ever been abused or do not feel safe at home. This information is not intended to replace advice given to you by your health care provider. Make sure you discuss any questions you have with your health care provider. Document Released: 05/05/2011 Document Revised: 03/27/2016 Document Reviewed: 07/24/2015 Elsevier Interactive Patient Education  2017 [place] K. Panosh M.D.  Fritz Pickerel dcv status  Echo 2011  midl hypokinesis lbbb   Mild lvh  Ef 45 %  Carotid  Disease  She has in the past seen cardiology 2011 and declined further workup for new cardiovascular status. See notes from Dr. Stanford Breed. I have recently referred her in the past that she is not been able to complete these referrals. She is very hesitant to take him financial risk and we did not discuss today another cardiology referral. We did discuss importance of blood pressure control for her cardiovascular health. She states her blood pressure tends to go up and stress related to her husband's situation. We could consider getting updated echo   But she denies sig sx at this time   .

## 2017-03-17 ENCOUNTER — Ambulatory Visit (INDEPENDENT_AMBULATORY_CARE_PROVIDER_SITE_OTHER): Payer: Medicare Other | Admitting: Internal Medicine

## 2017-03-17 ENCOUNTER — Encounter: Payer: Self-pay | Admitting: Internal Medicine

## 2017-03-17 VITALS — BP 150/90 | HR 63 | Temp 98.0°F | Ht 62.5 in | Wt 129.6 lb

## 2017-03-17 DIAGNOSIS — E785 Hyperlipidemia, unspecified: Secondary | ICD-10-CM

## 2017-03-17 DIAGNOSIS — Z79899 Other long term (current) drug therapy: Secondary | ICD-10-CM | POA: Diagnosis not present

## 2017-03-17 DIAGNOSIS — Z789 Other specified health status: Secondary | ICD-10-CM | POA: Diagnosis not present

## 2017-03-17 DIAGNOSIS — I119 Hypertensive heart disease without heart failure: Secondary | ICD-10-CM

## 2017-03-17 DIAGNOSIS — H409 Unspecified glaucoma: Secondary | ICD-10-CM

## 2017-03-17 DIAGNOSIS — Z0000 Encounter for general adult medical examination without abnormal findings: Secondary | ICD-10-CM | POA: Diagnosis not present

## 2017-03-17 DIAGNOSIS — I10 Essential (primary) hypertension: Secondary | ICD-10-CM | POA: Diagnosis not present

## 2017-03-17 LAB — LIPID PANEL
CHOL/HDL RATIO: 3
Cholesterol: 227 mg/dL — ABNORMAL HIGH (ref 0–200)
HDL: 82.8 mg/dL (ref >39.00)
LDL CALC: 133 mg/dL — AB (ref 0–99)
NonHDL: 143.9
Triglycerides: 53 mg/dL (ref 0.0–149.0)
VLDL: 10.6 mg/dL (ref 0.0–40.0)

## 2017-03-17 LAB — HEPATIC FUNCTION PANEL
ALBUMIN: 4.4 g/dL (ref 3.5–5.2)
ALK PHOS: 46 U/L (ref 39–117)
ALT: 10 U/L (ref 0–35)
AST: 18 U/L (ref 0–37)
Bilirubin, Direct: 0.1 mg/dL (ref 0.0–0.3)
TOTAL PROTEIN: 6.7 g/dL (ref 6.0–8.3)
Total Bilirubin: 0.6 mg/dL (ref 0.2–1.2)

## 2017-03-17 LAB — BASIC METABOLIC PANEL WITH GFR
BUN: 14 mg/dL (ref 6–23)
CO2: 33 meq/L — ABNORMAL HIGH (ref 19–32)
Calcium: 9.7 mg/dL (ref 8.4–10.5)
Chloride: 104 meq/L (ref 96–112)
Creatinine, Ser: 0.93 mg/dL (ref 0.40–1.20)
GFR: 61.96 mL/min
Glucose, Bld: 80 mg/dL (ref 70–99)
Potassium: 3.9 meq/L (ref 3.5–5.1)
Sodium: 141 meq/L (ref 135–145)

## 2017-03-17 NOTE — Telephone Encounter (Signed)
Had visit with patient today Please send an appeal for the denied prior authorization she has tried losartan and valsartan products in the past and had side effects or they were unaffected. Please see my note. She is very sensitive to medicines.

## 2017-03-17 NOTE — Patient Instructions (Addendum)
We'll try to put through prior authorization of heel to keep you on your same blood pressure medicine since you have tried the previous medicines before and have great concerns about side effects. Advise blood pressure readings should be below 140 preferably 120/80. Let you know blood results when available. Plan follow-up visit depending on blood pressure results and how you were doing. Giving you a prescription for the new shingles vaccine check with your insurance about best way to receive this for coverage. Health Maintenance, Female Adopting a healthy lifestyle and getting preventive care can go a long way to promote health and wellness. Talk with your health care provider about what schedule of regular examinations is right for you. This is a good chance for you to check in with your provider about disease prevention and staying healthy. In between checkups, there are plenty of things you can do on your own. Experts have done a lot of research about which lifestyle changes and preventive measures are most likely to keep you healthy. Ask your health care provider for more information. Weight and diet Eat a healthy diet  Be sure to include plenty of vegetables, fruits, low-fat dairy products, and lean protein.  Do not eat a lot of foods high in solid fats, added sugars, or salt.  Get regular exercise. This is one of the most important things you can do for your health.  Most adults should exercise for at least 150 minutes each week. The exercise should increase your heart rate and make you sweat (moderate-intensity exercise).  Most adults should also do strengthening exercises at least twice a week. This is in addition to the moderate-intensity exercise. Maintain a healthy weight  Body mass index (BMI) is a measurement that can be used to identify possible weight problems. It estimates body fat based on height and weight. Your health care provider can help determine your BMI and help you  achieve or maintain a healthy weight.  For females 70 years of age and older:  A BMI below 18.5 is considered underweight.  A BMI of 18.5 to 24.9 is normal.  A BMI of 25 to 29.9 is considered overweight.  A BMI of 30 and above is considered obese. Watch levels of cholesterol and blood lipids  You should start having your blood tested for lipids and cholesterol at 78 years of age, then have this test every 5 years.  You may need to have your cholesterol levels checked more often if:  Your lipid or cholesterol levels are high.  You are older than 78 years of age.  You are at high risk for heart disease. Cancer screening Lung Cancer  Lung cancer screening is recommended for adults 18-24 years old who are at high risk for lung cancer because of a history of smoking.  A yearly low-dose CT scan of the lungs is recommended for people who:  Currently smoke.  Have quit within the past 15 years.  Have at least a 30-pack-year history of smoking. A pack year is smoking an average of one pack of cigarettes a day for 1 year.  Yearly screening should continue until it has been 15 years since you quit.  Yearly screening should stop if you develop a health problem that would prevent you from having lung cancer treatment. Breast Cancer  Practice breast self-awareness. This means understanding how your breasts normally appear and feel.  It also means doing regular breast self-exams. Let your health care provider know about any changes, no matter how  small.  If you are in your 20s or 30s, you should have a clinical breast exam (CBE) by a health care provider every 1-3 years as part of a regular health exam.  If you are 40 or older, have a CBE every year. Also consider having a breast X-ray (mammogram) every year.  If you have a family history of breast cancer, talk to your health care provider about genetic screening.  If you are at high risk for breast cancer, talk to your health care  provider about having an MRI and a mammogram every year.  Breast cancer gene (BRCA) assessment is recommended for women who have family members with BRCA-related cancers. BRCA-related cancers include:  Breast.  Ovarian.  Tubal.  Peritoneal cancers.  Results of the assessment will determine the need for genetic counseling and BRCA1 and BRCA2 testing. Cervical Cancer  Your health care provider may recommend that you be screened regularly for cancer of the pelvic organs (ovaries, uterus, and vagina). This screening involves a pelvic examination, including checking for microscopic changes to the surface of your cervix (Pap test). You may be encouraged to have this screening done every 3 years, beginning at age 17.  For women ages 38-65, health care providers may recommend pelvic exams and Pap testing every 3 years, or they may recommend the Pap and pelvic exam, combined with testing for human papilloma virus (HPV), every 5 years. Some types of HPV increase your risk of cervical cancer. Testing for HPV may also be done on women of any age with unclear Pap test results.  Other health care providers may not recommend any screening for nonpregnant women who are considered low risk for pelvic cancer and who do not have symptoms. Ask your health care provider if a screening pelvic exam is right for you.  If you have had past treatment for cervical cancer or a condition that could lead to cancer, you need Pap tests and screening for cancer for at least 20 years after your treatment. If Pap tests have been discontinued, your risk factors (such as having a new sexual partner) need to be reassessed to determine if screening should resume. Some women have medical problems that increase the chance of getting cervical cancer. In these cases, your health care provider may recommend more frequent screening and Pap tests. Colorectal Cancer  This type of cancer can be detected and often prevented.  Routine  colorectal cancer screening usually begins at 78 years of age and continues through 78 years of age.  Your health care provider may recommend screening at an earlier age if you have risk factors for colon cancer.  Your health care provider may also recommend using home test kits to check for hidden blood in the stool.  A small camera at the end of a tube can be used to examine your colon directly (sigmoidoscopy or colonoscopy). This is done to check for the earliest forms of colorectal cancer.  Routine screening usually begins at age 8.  Direct examination of the colon should be repeated every 5-10 years through 78 years of age. However, you may need to be screened more often if early forms of precancerous polyps or small growths are found. Skin Cancer  Check your skin from head to toe regularly.  Tell your health care provider about any new moles or changes in moles, especially if there is a change in a mole's shape or color.  Also tell your health care provider if you have a mole that is  larger than the size of a pencil eraser.  Always use sunscreen. Apply sunscreen liberally and repeatedly throughout the day.  Protect yourself by wearing long sleeves, pants, a wide-brimmed hat, and sunglasses whenever you are outside. Heart disease, diabetes, and high blood pressure  High blood pressure causes heart disease and increases the risk of stroke. High blood pressure is more likely to develop in:  People who have blood pressure in the high end of the normal range (130-139/85-89 mm Hg).  People who are overweight or obese.  People who are African American.  If you are 18-39 years of age, have your blood pressure checked every 3-5 years. If you are 40 years of age or older, have your blood pressure checked every year. You should have your blood pressure measured twice-once when you are at a hospital or clinic, and once when you are not at a hospital or clinic. Record the average of the two  measurements. To check your blood pressure when you are not at a hospital or clinic, you can use:  An automated blood pressure machine at a pharmacy.  A home blood pressure monitor.  If you are between 55 years and 79 years old, ask your health care provider if you should take aspirin to prevent strokes.  Have regular diabetes screenings. This involves taking a blood sample to check your fasting blood sugar level.  If you are at a normal weight and have a low risk for diabetes, have this test once every three years after 78 years of age.  If you are overweight and have a high risk for diabetes, consider being tested at a younger age or more often. Preventing infection Hepatitis B  If you have a higher risk for hepatitis B, you should be screened for this virus. You are considered at high risk for hepatitis B if:  You were born in a country where hepatitis B is common. Ask your health care provider which countries are considered high risk.  Your parents were born in a high-risk country, and you have not been immunized against hepatitis B (hepatitis B vaccine).  You have HIV or AIDS.  You use needles to inject street drugs.  You live with someone who has hepatitis B.  You have had sex with someone who has hepatitis B.  You get hemodialysis treatment.  You take certain medicines for conditions, including cancer, organ transplantation, and autoimmune conditions. Hepatitis C  Blood testing is recommended for:  Everyone born from 1945 through 1965.  Anyone with known risk factors for hepatitis C. Sexually transmitted infections (STIs)  You should be screened for sexually transmitted infections (STIs) including gonorrhea and chlamydia if:  You are sexually active and are younger than 78 years of age.  You are older than 78 years of age and your health care provider tells you that you are at risk for this type of infection.  Your sexual activity has changed since you were last  screened and you are at an increased risk for chlamydia or gonorrhea. Ask your health care provider if you are at risk.  If you do not have HIV, but are at risk, it may be recommended that you take a prescription medicine daily to prevent HIV infection. This is called pre-exposure prophylaxis (PrEP). You are considered at risk if:  You are sexually active and do not regularly use condoms or know the HIV status of your partner(s).  You take drugs by injection.  You are sexually active with a partner   who has HIV. Talk with your health care provider about whether you are at high risk of being infected with HIV. If you choose to begin PrEP, you should first be tested for HIV. You should then be tested every 3 months for as long as you are taking PrEP. Pregnancy  If you are premenopausal and you may become pregnant, ask your health care provider about preconception counseling.  If you may become pregnant, take 400 to 800 micrograms (mcg) of folic acid every day.  If you want to prevent pregnancy, talk to your health care provider about birth control (contraception). Osteoporosis and menopause  Osteoporosis is a disease in which the bones lose minerals and strength with aging. This can result in serious bone fractures. Your risk for osteoporosis can be identified using a bone density scan.  If you are 65 years of age or older, or if you are at risk for osteoporosis and fractures, ask your health care provider if you should be screened.  Ask your health care provider whether you should take a calcium or vitamin D supplement to lower your risk for osteoporosis.  Menopause may have certain physical symptoms and risks.  Hormone replacement therapy may reduce some of these symptoms and risks. Talk to your health care provider about whether hormone replacement therapy is right for you. Follow these instructions at home:  Schedule regular health, dental, and eye exams.  Stay current with your  immunizations.  Do not use any tobacco products including cigarettes, chewing tobacco, or electronic cigarettes.  If you are pregnant, do not drink alcohol.  If you are breastfeeding, limit how much and how often you drink alcohol.  Limit alcohol intake to no more than 1 drink per day for nonpregnant women. One drink equals 12 ounces of beer, 5 ounces of wine, or 1 ounces of hard liquor.  Do not use street drugs.  Do not share needles.  Ask your health care provider for help if you need support or information about quitting drugs.  Tell your health care provider if you often feel depressed.  Tell your health care provider if you have ever been abused or do not feel safe at home. This information is not intended to replace advice given to you by your health care provider. Make sure you discuss any questions you have with your health care provider. Document Released: 05/05/2011 Document Revised: 03/27/2016 Document Reviewed: 07/24/2015 Elsevier Interactive Patient Education  2017 [employer]  

## 2017-03-18 NOTE — Telephone Encounter (Signed)
The Appeal has been filled out & faxed back to insurance.

## 2017-03-19 NOTE — Telephone Encounter (Signed)
PA approved until April of 2019. Pharmacy is aware & message was left for patient by the insurance company.

## 2017-06-10 ENCOUNTER — Other Ambulatory Visit: Payer: Self-pay | Admitting: Internal Medicine

## 2017-07-24 ENCOUNTER — Encounter: Payer: Self-pay | Admitting: Internal Medicine

## 2017-07-28 ENCOUNTER — Ambulatory Visit (INDEPENDENT_AMBULATORY_CARE_PROVIDER_SITE_OTHER): Payer: Medicare Other | Admitting: *Deleted

## 2017-07-28 ENCOUNTER — Ambulatory Visit: Payer: Self-pay | Admitting: Family Medicine

## 2017-07-28 DIAGNOSIS — Z23 Encounter for immunization: Secondary | ICD-10-CM | POA: Diagnosis not present

## 2017-12-25 DIAGNOSIS — H401132 Primary open-angle glaucoma, bilateral, moderate stage: Secondary | ICD-10-CM | POA: Diagnosis not present

## 2017-12-25 DIAGNOSIS — H26492 Other secondary cataract, left eye: Secondary | ICD-10-CM | POA: Diagnosis not present

## 2017-12-25 DIAGNOSIS — Z961 Presence of intraocular lens: Secondary | ICD-10-CM | POA: Diagnosis not present

## 2018-01-22 DIAGNOSIS — H26492 Other secondary cataract, left eye: Secondary | ICD-10-CM | POA: Diagnosis not present

## 2018-03-26 ENCOUNTER — Other Ambulatory Visit: Payer: Self-pay

## 2018-03-26 NOTE — Patient Outreach (Signed)
[place] [facility]) Care Management  03/26/2018  Kristina Davidson Nov 12, 1940 [id number]    Medication Adherence call to Kristina Davidson patient did not answer patient is due on Telmisartan/Hctz 40/12.5, [facility] said they have the medication ready for patient to pick up. Kristina Davidson is showing past due under [place].  [facility] Management Direct Dial [phone]  Fax [phone] [email or web address]

## 2018-04-28 ENCOUNTER — Other Ambulatory Visit: Payer: Self-pay

## 2018-04-28 NOTE — Patient Outreach (Signed)
[place] [facility]) Care Management  04/28/2018  Kristina Davidson [birth year] [id number]    Medication Adherence call to [name] left a message for patient to call back patient is due on Telmisartan/Hctz 40/12.5. [name] is showing past due under [place].  [facility] Management Direct Dial [phone]  Fax [phone] [email or web address]

## 2018-05-28 DIAGNOSIS — H401132 Primary open-angle glaucoma, bilateral, moderate stage: Secondary | ICD-10-CM | POA: Diagnosis not present

## 2018-06-20 ENCOUNTER — Other Ambulatory Visit: Payer: Self-pay | Admitting: Internal Medicine

## 2018-06-21 ENCOUNTER — Other Ambulatory Visit: Payer: Self-pay | Admitting: Internal Medicine

## 2018-08-11 ENCOUNTER — Telehealth: Payer: Self-pay | Admitting: Internal Medicine

## 2018-08-11 NOTE — Telephone Encounter (Signed)
Copied from [place] [id number]. Topic: Quick Communication - Rx Refill/Question >> Aug 11, 2018 12:42 PM Kristina Davidson wrote: Medication: telmisartan-hydrochlorothiazide (MICARDIS HCT) 40-12.5 MG tablet  Has the patient contacted their pharmacy? no Preferred Pharmacy (with phone number or street name): [id number] - [place], [place] - [address] [phone] (Phone) [phone] (Fax)   Patient stated she only has 18 pills left and she is requesting to have this medication filled to last her till her apt on 09-09-18 with Dr.Panosh

## 2018-08-17 MED ORDER — TELMISARTAN-HCTZ 40-12.5 MG PO TABS: 1.0000 | ORAL_TABLET | Freq: Every day | ORAL | 0 refills | Status: DC

## 2018-08-17 NOTE — Telephone Encounter (Signed)
ATC mailbox full  Medication refilled until 09/2018 visit

## 2018-09-08 NOTE — Progress Notes (Signed)
Chief Complaint  Patient presents with  . Annual Exam    No new concerns    HPI: Kristina Davidson 77 y.o. comes in today for Preventive Medicare exam/ wellness visit . And Chronic disease management Since last visit. Hs had multiple loss in family  Husband passe last feb?  And others  Here with daughter today  Doing well.   BP: had forgotten to take med in past but taking reg now  And  bp check at home per daughter in 126 range  Every day but today was up to 160 and ondays going to med appts   Rest of time it is  Controlled. Denies se of meds   HLD:  Was on no flush niacin as is statin intolerant  And off for now  jsut forgetting to get back on .  Only other meds are dye drops seen at  [facility] and stable situation.   Health Maintenance  Topic Date Due  . DEXA SCAN  02/21/2004  . TETANUS/TDAP  11/03/2013  . INFLUENZA VACCINE  06/03/2018  . PNA vac Low Risk Adult  Completed   Health Maintenance Review LIFESTYLE:  Exercise:   Not much .  Chores no physical limitation  Tobacco/ETS:  no Alcohol:   no Sugar beverages: not often.  Sleep: not quite 8  Drug use: no HH:  2  No pets.  Neg fam hx colon cancer  declining colonscopy  And declining mammo  ( pain in past)  Enjoys [employer] basketball and some comp games and bible studies   bp goo at  home from daughter    This am 164 . Yesterday 126Hearing: ok   Vision:  No limitations at present . Last eye check UTD  [facility]  Stable.   Safety:  Has smoke detector and wears seat belts.  . No excess sun exposure. Sees dentist regularly.  Falls:  n.  Memory: Felt to be good  , no concern from her or her family.  Depression: No anhedonia unusual crying or depressive symptoms  Nutrition: Eats well balanced diet; adequate calcium and vitamin D. No swallowing chewing problems.  Injury: no major injuries in the last six months.  Other healthcare providers:  Reviewed today .Marland Kitchen   Preventive parameters: up-to-date  Reviewed   ADLS:   There are no  problems or need for assistance  driving, feeding, obtaining food, dressing, toileting and bathing, managing money using phone. She is independent.   ROS:  GEN/ HEENT: No fever, significant weight changes sweats headaches vision problems hearing changes, CV/ PULM; No chest pain shortness of breath cough, syncope,edema  change in exercise tolerance. GI /GU: No adominal pain, vomiting, change in bowel habits. No blood in the stool. No significant GU symptoms. SKIN/HEME: ,no acute skin rashes suspicious lesions or bleeding. No lymphadenopathy, nodules, masses.  NEURO/ PSYCH:  No neurologic signs such as weakness numbness. No depression anxiety. IMM/ Allergy: No unusual infections.  Allergy .   REST of 12 system review negative except as per HPI   Past Medical History:  Diagnosis Date  . Cardiomyopathy   . Glaucoma    dev vision left eye  . Hyperlipidemia   . Hypertension    lvh mild 2011  . Left bundle branch block   . Varicella   . Vitamin D deficiency     Family History  Problem Relation Age of Onset  . Heart disease Father   . Coronary artery disease Other   . Hypertension Other   .  Kidney disease Other     Social History   Socioeconomic History  . Marital status: Married    Spouse name: Not on file  . Number of children: Not on file  . Years of education: Not on file  . Highest education level: Not on file  Occupational History  . Not on file  Social Needs  . Financial resource strain: Not on file  . Food insecurity:    Worry: Not on file    Inability: Not on file  . Transportation needs:    Medical: Not on file    Non-medical: Not on file  Tobacco Use  . Smoking status: Never Smoker  . Smokeless tobacco: Never Used  Substance and Sexual Activity  . Alcohol use: No  . Drug use: No  . Sexual activity: Not on file  Lifestyle  . Physical activity:    Days per week: Not on file    Minutes per session: Not on file  . Stress: Not on file  Relationships  .  Social connections:    Talks on phone: Not on file    Gets together: Not on file    Attends religious service: Not on file    Active member of club or organization: Not on file    Attends meetings of clubs or organizations: Not on file    Relationship status: Not on file  Other Topics Concern  . Not on file  Social History Narrative   Retired   [employer] of 2   Widowed 2019   Regular exercise- no   Grandchild with special needs      G3P3    Outpatient Encounter Medications as of 09/09/2018  Medication Sig  . latanoprost (XALATAN) 0.005 % ophthalmic solution Place 1 drop into both eyes at bedtime as needed.  . Magnesium 250 MG TABS Take by mouth daily. Reported on 03/18/2016  . niacin (NIASPAN) 1000 MG CR tablet Take 1 tablet (1,000 mg total) by mouth at bedtime.  Vladimir Faster Glycol-Propyl Glycol (SYSTANE) 0.4-0.3 % SOLN Apply to eye. Use as directed   . telmisartan-hydrochlorothiazide (MICARDIS HCT) 40-12.5 MG tablet Take 1 tablet by mouth daily.   No facility-administered encounter medications on file as of 09/09/2018.     EXAM:  BP (!) 152/84 (BP Location: Left Arm, Patient Position: Sitting, Cuff Size: Normal) Comment: white coat effect  per daughter and pat  120 130 at home  Pulse 68   Temp 97.6 F (36.4 C) (Oral)   Ht 5' 1.75" (1.568 m)   Wt 131 lb 3.2 oz (59.5 kg)   BMI 24.19 kg/m   Body mass index is 24.19 kg/m. Wt Readings from Last 3 Encounters:  09/09/18 131 lb 3.2 oz (59.5 kg)  03/17/17 129 lb 9.6 oz (58.8 kg)  03/18/16 122 lb (55.3 kg)    Physical Exam: Vital signs reviewed QJJ:HERD is a well-developed well-nourished alert cooperative   who appears stated age in no acute distress.  HEENT: normocephalic atraumatic , Eyes: EOM's full, conjunctiva pink , Nares: paten,t no deformity discharge or tenderness., Ears: no deformity EAC's clear TMs with normal landmarks. Mouth: clear OP, no lesions, edema.  Moist mucous membranes. Dentition in adequate repair. NECK:  supple without masses, thyromegaly left carotid bruit heard  CHEST/PULM:  Clear to auscultation and percussion breath sounds equal no wheeze , rales or rhonchi. No chest wall deformities or tenderness.Breast: normal by inspection . No dimpling, discharge, masses, tenderness or discharge . CV: PMI is nondisplaced, S1 S2 no  gallops, murmurs, rubs. Peripheral pulses arewithout delay.No JVD .  ABDOMEN: Bowel sounds normal nontender  No guard or rebound, no hepato splenomegal no CVA tenderness.   Extremtities:  No clubbing cyanosis or edema, no acute joint swelling or redness no focal atrophy mild djd changes  NEURO:  Oriented x3, cranial nerves 3-12 appear to be intact, no obvious focal weakness,gait within normal limits no abnormal reflexes or asymmetrical SKIN: No acute rashes normal turgor, color, no bruising or petechiae. PSYCH: Oriented, good eye contact, no obvious depression anxiety, cognition and judgment appear normal. LN: no cervical axillary = adenopathy No noted deficits in memory, attention, and speech.   Lab Results  Component Value Date   WBC 5.8 07/16/2010   HGB 13.2 07/16/2010   HCT 39.3 07/16/2010   PLT 223.0 07/16/2010   GLUCOSE 80 03/17/2017   CHOL 227 (H) 03/17/2017   TRIG 53.0 03/17/2017   HDL 82.80 03/17/2017   LDLDIRECT 116.3 07/29/2011   LDLCALC 133 (H) 03/17/2017   ALT 10 03/17/2017   AST 18 03/17/2017   NA 141 03/17/2017   K 3.9 03/17/2017   CL 104 03/17/2017   CREATININE 0.93 03/17/2017   BUN 14 03/17/2017   CO2 33 (H) 03/17/2017   TSH 1.34 03/18/2016    ASSESSMENT AND PLAN:  Discussed the following assessment and plan:  Visit for preventive health examination  Medication management - Plan: Basic metabolic panel, CBC with Differential/Platelet, Hepatic function panel, Lipid panel, TSH  Left bundle branch block (LBBB) - Plan: Basic metabolic panel, CBC with Differential/Platelet, Hepatic function panel, Lipid panel, TSH  Statin intolerance - Plan:  Basic metabolic panel, CBC with Differential/Platelet, Hepatic function panel, Lipid panel, TSH  Hyperlipidemia, unspecified hyperlipidemia type - Plan: Basic metabolic panel, CBC with Differential/Platelet, Hepatic function panel, Lipid panel, TSH, VAS US CAROTID, CANCELED: US Carotid Bilateral  Essential hypertension - Plan: Basic metabolic panel, CBC with Differential/Platelet, Hepatic function panel, Lipid panel, TSH, VAS US CAROTID, CANCELED: US Carotid Bilateral  Need for influenza vaccination - Plan: Flu vaccine HIGH DOSE PF (Fluzone High dose)  Stenosis of left carotid artery - Plan: Basic metabolic panel, CBC with Differential/Platelet, Hepatic function panel, Lipid panel, TSH, VAS US CAROTID, CANCELED: US Carotid Bilateral  White coat syndrome with hypertension  Glaucoma of both eyes, unspecified glaucoma type Ht controlled at homoe and apparently only when goes to med facility where up . Back taking medication withotu se . Has carotid disease  And ok to take asa  Needs updated  Carotid study .  decide no next lipid med poss zetia or  Niacin   Disc  cologuard  Will  Let us know  Patient Care Team: Panosh, Standley Brooking, MD as PCP - General Stanford Breed, Denice Bors, MD (Cardiology) Verneita Griffes, MD (Ophthalmology)  Patient Instructions  Continue blood pressure medication and monitoring .   Goal is below 140/90 prefer 120 130 range .  Glad y ou are doing well.   You are due for an updated carotid artery  Check to make sure not progressive.   Will   be contacted about that testing .   Continue lifestyle intervention healthy eating and exercise . We may need to get back on a different medication .   For cholesterol to prevent  Progression of  Plaque in the cartotid artery area.   Let us know if you want to do cologuard  For colon screening .    Preventive Care 20 Years and Older, Female Preventive care refers  to lifestyle choices and visits with your health care provider that can  promote health and wellness. What does preventive care include?  A yearly physical exam. This is also called an annual well check.  Dental exams once or twice a year.  Routine eye exams. Ask your health care provider how often you should have your eyes checked.  Personal lifestyle choices, including: ? Daily care of your teeth and gums. ? Regular physical activity. ? Eating a healthy diet. ? Avoiding tobacco and drug use. ? Limiting alcohol use. ? Practicing safe sex. ? Taking low-dose aspirin every day. ? Taking vitamin and mineral supplements as recommended by your health care provider. What happens during an annual well check? The services and screenings done by your health care provider during your annual well check will depend on your age, overall health, lifestyle risk factors, and family history of disease. Counseling Your health care provider may ask you questions about your:  Alcohol use.  Tobacco use.  Drug use.  Emotional well-being.  Home and relationship well-being.  Sexual activity.  Eating habits.  History of falls.  Memory and ability to understand (cognition).  Work and work Statistician.  Reproductive health.  Screening You may have the following tests or measurements:  Height, weight, and BMI.  Blood pressure.  Lipid and cholesterol levels. These may be checked every 5 years, or more frequently if you are over 43 years old.  Skin check.  Lung cancer screening. You may have this screening every year starting at age 30 if you have a 30-pack-year history of smoking and currently smoke or have quit within the past 15 years.  Fecal occult blood test (FOBT) of the stool. You may have this test every year starting at age 65.  Flexible sigmoidoscopy or colonoscopy. You may have a sigmoidoscopy every 5 years or a colonoscopy every 10 years starting at age 52.  Hepatitis C blood test.  Hepatitis B blood test.  Sexually transmitted disease  (STD) testing.  Diabetes screening. This is done by checking your blood sugar (glucose) after you have not eaten for a while (fasting). You may have this done every 1-3 years.  Bone density scan. This is done to screen for osteoporosis. You may have this done starting at age 43.  Mammogram. This may be done every 1-2 years. Talk to your health care provider about how often you should have regular mammograms.  Talk with your health care provider about your test results, treatment options, and if necessary, the need for more tests. Vaccines Your health care provider may recommend certain vaccines, such as:  Influenza vaccine. This is recommended every year.  Tetanus, diphtheria, and acellular pertussis (Tdap, Td) vaccine. You may need a Td booster every 10 years.  Varicella vaccine. You may need this if you have not been vaccinated.  Zoster vaccine. You may need this after age 67.  Measles, mumps, and rubella (MMR) vaccine. You may need at least one dose of MMR if you were born in 1957 or later. You may also need a second dose.  Pneumococcal 13-valent conjugate (PCV13) vaccine. One dose is recommended after age 2.  Pneumococcal polysaccharide (PPSV23) vaccine. One dose is recommended after age 29.  Meningococcal vaccine. You may need this if you have certain conditions.  Hepatitis A vaccine. You may need this if you have certain conditions or if you travel or work in places where you may be exposed to hepatitis A.  Hepatitis B vaccine. You may need this  if you have certain conditions or if you travel or work in places where you may be exposed to hepatitis B.  Haemophilus influenzae type b (Hib) vaccine. You may need this if you have certain conditions.  Talk to your health care provider about which screenings and vaccines you need and how often you need them. This information is not intended to replace advice given to you by your health care provider. Make sure you discuss any  questions you have with your health care provider. Document Released: 11/16/2015 Document Revised: 07/09/2016 Document Reviewed: 08/21/2015 Elsevier Interactive Patient Education  2018 [place]. Panosh M.D.

## 2018-09-09 ENCOUNTER — Encounter: Payer: Self-pay | Admitting: Internal Medicine

## 2018-09-09 ENCOUNTER — Other Ambulatory Visit: Payer: Self-pay | Admitting: Internal Medicine

## 2018-09-09 ENCOUNTER — Ambulatory Visit (INDEPENDENT_AMBULATORY_CARE_PROVIDER_SITE_OTHER): Payer: Medicare Other | Admitting: Internal Medicine

## 2018-09-09 VITALS — BP 152/84 | HR 68 | Temp 97.6°F | Ht 61.75 in | Wt 131.2 lb

## 2018-09-09 DIAGNOSIS — Z0000 Encounter for general adult medical examination without abnormal findings: Secondary | ICD-10-CM | POA: Diagnosis not present

## 2018-09-09 DIAGNOSIS — Z23 Encounter for immunization: Secondary | ICD-10-CM | POA: Diagnosis not present

## 2018-09-09 DIAGNOSIS — I6522 Occlusion and stenosis of left carotid artery: Secondary | ICD-10-CM

## 2018-09-09 DIAGNOSIS — I447 Left bundle-branch block, unspecified: Secondary | ICD-10-CM | POA: Diagnosis not present

## 2018-09-09 DIAGNOSIS — H409 Unspecified glaucoma: Secondary | ICD-10-CM | POA: Diagnosis not present

## 2018-09-09 DIAGNOSIS — Z79899 Other long term (current) drug therapy: Secondary | ICD-10-CM | POA: Diagnosis not present

## 2018-09-09 DIAGNOSIS — E785 Hyperlipidemia, unspecified: Secondary | ICD-10-CM

## 2018-09-09 DIAGNOSIS — Z789 Other specified health status: Secondary | ICD-10-CM | POA: Diagnosis not present

## 2018-09-09 DIAGNOSIS — I10 Essential (primary) hypertension: Secondary | ICD-10-CM | POA: Diagnosis not present

## 2018-09-09 LAB — LIPID PANEL
CHOL/HDL RATIO: 3
CHOLESTEROL: 244 mg/dL — AB (ref 0–200)
HDL: 78.1 mg/dL (ref >39.00)
LDL CALC: 155 mg/dL — AB (ref 0–99)
NonHDL: 165.98
TRIGLYCERIDES: 56 mg/dL (ref 0.0–149.0)
VLDL: 11.2 mg/dL (ref 0.0–40.0)

## 2018-09-09 LAB — HEPATIC FUNCTION PANEL
ALT: 12 U/L (ref 0–35)
AST: 19 U/L (ref 0–37)
Albumin: 4.6 g/dL (ref 3.5–5.2)
Alkaline Phosphatase: 45 U/L (ref 39–117)
BILIRUBIN DIRECT: 0.1 mg/dL (ref 0.0–0.3)
BILIRUBIN TOTAL: 0.7 mg/dL (ref 0.2–1.2)
Total Protein: 7.1 g/dL (ref 6.0–8.3)

## 2018-09-09 LAB — CBC WITH DIFFERENTIAL/PLATELET
BASOS PCT: 0.6 % (ref 0.0–3.0)
Basophils Absolute: 0.1 10*3/uL (ref 0.0–0.1)
EOS ABS: 0.1 10*3/uL (ref 0.0–0.7)
EOS PCT: 0.9 % (ref 0.0–5.0)
HCT: 38.6 % (ref 36.0–46.0)
HEMOGLOBIN: 12.9 g/dL (ref 12.0–15.0)
LYMPHS ABS: 7.1 10*3/uL — AB (ref 0.7–4.0)
Lymphocytes Relative: 73.5 % — ABNORMAL HIGH (ref 12.0–46.0)
MCHC: 33.3 g/dL (ref 30.0–36.0)
MCV: 88.3 fl (ref 78.0–100.0)
MONO ABS: 0.3 10*3/uL (ref 0.1–1.0)
Monocytes Relative: 2.7 % — ABNORMAL LOW (ref 3.0–12.0)
NEUTROS PCT: 22.3 % — AB (ref 43.0–77.0)
Neutro Abs: 2.2 10*3/uL (ref 1.4–7.7)
Platelets: 186 10*3/uL (ref 150.0–400.0)
RBC: 4.37 Mil/uL (ref 3.87–5.11)
RDW: 13.9 % (ref 11.5–15.5)
WBC: 9.6 10*3/uL (ref 4.0–10.5)

## 2018-09-09 LAB — BASIC METABOLIC PANEL
BUN: 16 mg/dL (ref 6–23)
CHLORIDE: 98 meq/L (ref 96–112)
CO2: 30 mEq/L (ref 19–32)
CREATININE: 0.95 mg/dL (ref 0.40–1.20)
Calcium: 9.5 mg/dL (ref 8.4–10.5)
GFR: 60.23 mL/min (ref >60.00)
GLUCOSE: 84 mg/dL (ref 70–99)
POTASSIUM: 3.5 meq/L (ref 3.5–5.1)
Sodium: 135 mEq/L (ref 135–145)

## 2018-09-09 LAB — TSH: TSH: 3.29 u[IU]/mL (ref 0.35–4.50)

## 2018-09-09 NOTE — Patient Instructions (Signed)
Continue blood pressure medication and monitoring .   Goal is below 140/90 prefer 120 130 range .  Glad y ou are doing well.   You are due for an updated carotid artery  Check to make sure not progressive.   Will   be contacted about that testing .   Continue lifestyle intervention healthy eating and exercise . We may need to get back on a different medication .   For cholesterol to prevent  Progression of  Plaque in the cartotid artery area.   Let us know if you want to do cologuard  For colon screening .    Preventive Care 79 Years and Older, Female Preventive care refers to lifestyle choices and visits with your health care provider that can promote health and wellness. What does preventive care include?  A yearly physical exam. This is also called an annual well check.  Dental exams once or twice a year.  Routine eye exams. Ask your health care provider how often you should have your eyes checked.  Personal lifestyle choices, including: ? Daily care of your teeth and gums. ? Regular physical activity. ? Eating a healthy diet. ? Avoiding tobacco and drug use. ? Limiting alcohol use. ? Practicing safe sex. ? Taking low-dose aspirin every day. ? Taking vitamin and mineral supplements as recommended by your health care provider. What happens during an annual well check? The services and screenings done by your health care provider during your annual well check will depend on your age, overall health, lifestyle risk factors, and family history of disease. Counseling Your health care provider may ask you questions about your:  Alcohol use.  Tobacco use.  Drug use.  Emotional well-being.  Home and relationship well-being.  Sexual activity.  Eating habits.  History of falls.  Memory and ability to understand (cognition).  Work and work Statistician.  Reproductive health.  Screening You may have the following tests or measurements:  Height, weight, and  BMI.  Blood pressure.  Lipid and cholesterol levels. These may be checked every 5 years, or more frequently if you are over 35 years old.  Skin check.  Lung cancer screening. You may have this screening every year starting at age 66 if you have a 30-pack-year history of smoking and currently smoke or have quit within the past 15 years.  Fecal occult blood test (FOBT) of the stool. You may have this test every year starting at age 32.  Flexible sigmoidoscopy or colonoscopy. You may have a sigmoidoscopy every 5 years or a colonoscopy every 10 years starting at age 79.  Hepatitis C blood test.  Hepatitis B blood test.  Sexually transmitted disease (STD) testing.  Diabetes screening. This is done by checking your blood sugar (glucose) after you have not eaten for a while (fasting). You may have this done every 1-3 years.  Bone density scan. This is done to screen for osteoporosis. You may have this done starting at age 66.  Mammogram. This may be done every 1-2 years. Talk to your health care provider about how often you should have regular mammograms.  Talk with your health care provider about your test results, treatment options, and if necessary, the need for more tests. Vaccines Your health care provider may recommend certain vaccines, such as:  Influenza vaccine. This is recommended every year.  Tetanus, diphtheria, and acellular pertussis (Tdap, Td) vaccine. You may need a Td booster every 10 years.  Varicella vaccine. You may need this if you have  not been vaccinated.  Zoster vaccine. You may need this after age 22.  Measles, mumps, and rubella (MMR) vaccine. You may need at least one dose of MMR if you were born in 1957 or later. You may also need a second dose.  Pneumococcal 13-valent conjugate (PCV13) vaccine. One dose is recommended after age 45.  Pneumococcal polysaccharide (PPSV23) vaccine. One dose is recommended after age 48.  Meningococcal vaccine. You may need  this if you have certain conditions.  Hepatitis A vaccine. You may need this if you have certain conditions or if you travel or work in places where you may be exposed to hepatitis A.  Hepatitis B vaccine. You may need this if you have certain conditions or if you travel or work in places where you may be exposed to hepatitis B.  Haemophilus influenzae type b (Hib) vaccine. You may need this if you have certain conditions.  Talk to your health care provider about which screenings and vaccines you need and how often you need them. This information is not intended to replace advice given to you by your health care provider. Make sure you discuss any questions you have with your health care provider. Document Released: 11/16/2015 Document Revised: 07/09/2016 Document Reviewed: 08/21/2015 Elsevier Interactive Patient Education  [employer].

## 2018-09-16 ENCOUNTER — Encounter (HOSPITAL_COMMUNITY): Payer: Medicare Other

## 2018-10-06 MED ORDER — TELMISARTAN-HCTZ 40-12.5 MG PO TABS: 1.0000 | ORAL_TABLET | Freq: Every day | ORAL | 5 refills | Status: DC

## 2018-10-06 NOTE — Telephone Encounter (Signed)
Medication refilled to pharmacy Seen 09/09/18 with Dr Regis Bill, refill not needed until 09/17/18. Pt did not request at last OV. Per Notes Dr Regis Bill wants patient to stay on BP meds and monitor BP. Refill sent to pharmacy. Pt notified of refill. Apologized for delay in getting refill, there was cross up between the message and her OV.   Nothing further needed.

## 2018-10-11 ENCOUNTER — Ambulatory Visit (HOSPITAL_COMMUNITY)
Admission: RE | Admit: 2018-10-11 | Discharge: 2018-10-11 | Disposition: A | Discharge status: Home / Self Care | Payer: Medicare Other | Source: Ambulatory Visit | Attending: Cardiology | Admitting: Cardiology

## 2018-10-11 DIAGNOSIS — I6522 Occlusion and stenosis of left carotid artery: Secondary | ICD-10-CM | POA: Diagnosis not present

## 2018-12-01 ENCOUNTER — Encounter: Payer: Self-pay | Admitting: *Deleted

## 2019-04-09 ENCOUNTER — Other Ambulatory Visit: Payer: Self-pay | Admitting: Internal Medicine

## 2019-05-03 DIAGNOSIS — H401132 Primary open-angle glaucoma, bilateral, moderate stage: Secondary | ICD-10-CM | POA: Diagnosis not present

## 2019-10-25 DIAGNOSIS — H401132 Primary open-angle glaucoma, bilateral, moderate stage: Secondary | ICD-10-CM | POA: Diagnosis not present

## 2019-11-16 ENCOUNTER — Ambulatory Visit (INDEPENDENT_AMBULATORY_CARE_PROVIDER_SITE_OTHER): Payer: Medicare Other | Admitting: Internal Medicine

## 2019-11-16 ENCOUNTER — Encounter: Payer: Self-pay | Admitting: Internal Medicine

## 2019-11-16 ENCOUNTER — Other Ambulatory Visit: Payer: Self-pay

## 2019-11-16 ENCOUNTER — Telehealth: Payer: Self-pay | Admitting: Internal Medicine

## 2019-11-16 DIAGNOSIS — I10 Essential (primary) hypertension: Secondary | ICD-10-CM

## 2019-11-16 DIAGNOSIS — Z79899 Other long term (current) drug therapy: Secondary | ICD-10-CM | POA: Diagnosis not present

## 2019-11-16 DIAGNOSIS — H409 Unspecified glaucoma: Secondary | ICD-10-CM

## 2019-11-16 DIAGNOSIS — E785 Hyperlipidemia, unspecified: Secondary | ICD-10-CM

## 2019-11-16 MED ORDER — TELMISARTAN-HCTZ 40-12.5 MG PO TABS: 1.0000 | ORAL_TABLET | Freq: Every day | ORAL | 1 refills | Status: DC

## 2019-11-16 NOTE — Progress Notes (Signed)
Virtual Visit via Telephone Note  I connected with@ on 11/16/19 at  2:00 PM EST by telephone and verified that I am speaking with the correct person using two identifiers.   I discussed the limitations, risks, security and privacy concerns of performing an evaluation and management service by telephone and the limited availability of in person appointments. tThere may be a patient responsible charge related to this service. The patient expressed understanding and agreed to proceed.  Location patient: home Location provider: work office Participants present for the call: patient, provider Patient did not have a visit in the prior 7 days to address this/these issue(s).   History of Present Illness: Kristina Davidson  Presents for yearly  Check instead of in person  For her bp med and eval   still mowing the grass this summer  No major injury  cv neuro sx   Sees eye specialist and stable pressures   Bp ok checked and was 133/80 this am    No se of meds   Ran out an not taking the niaspan  Has been out for over a month .  Observations/Objective: Patient sounds personable and well on the phone. I do not appreciate any SOB. Speech and thought processing are grossly intact. Patient reported vitals: Lab Results  Component Value Date   WBC 9.6 09/09/2018   HGB 12.9 09/09/2018   HCT 38.6 09/09/2018   PLT 186.0 09/09/2018   GLUCOSE 84 09/09/2018   CHOL 244 (H) 09/09/2018   TRIG 56.0 09/09/2018   HDL 78.10 09/09/2018   LDLDIRECT 116.3 07/29/2011   LDLCALC 155 (H) 09/09/2018   ALT 12 09/09/2018   AST 19 09/09/2018   NA 135 09/09/2018   K 3.5 09/09/2018   CL 98 09/09/2018   CREATININE 0.95 09/09/2018   BUN 16 09/09/2018   CO2 30 09/09/2018   TSH 3.29 09/09/2018    Assessment and Plan: Essential hypertension - Plan: Basic metabolic panel, CBC with Differential, Hepatic function panel, Lipid panel  Medication management - Plan: Basic metabolic panel, CBC with Differential, Hepatic  function panel, Lipid panel  Glaucoma of both eyes, unspecified glaucoma type - Plan: Basic metabolic panel, CBC with Differential, Hepatic function panel, Lipid panel  White coat syndrome with hypertension - Plan: Basic metabolic panel, CBC with Differential, Hepatic function panel, Lipid panel  Hyperlipidemia, unspecified hyperlipidemia type - Plan: Basic metabolic panel, CBC with Differential, Hepatic function panel, Lipid panel   Ht seems controlled and med to continue Has stopped th niacin  And at this time will stay off and get labs   Because of age  May not be worth adding back med  She is healthy and  Plans on getting covid vaccine     Advised   fasting lab appt    Orders placed and then plan   In person  In 6-12 months ( since she has been  Seen in person at her eye appts) Follow Up Instructions: Lab monotoring is due     Get the covid vaccine  She agrees  [id number] [id number] [id number] 11-20 [id number] 21-30 I did not refer this patient for an OV in the next 24 hours for this/these issue(s).  I discussed the assessment and treatment plan with the patient. The patient was provided an opportunity to ask questions and answered. The patient agreed with the plan and demonstrated an understanding of the instructions.   The patient was advised to call back or seek an in-person evaluation if the symptoms  worsen or if the condition fails to improve as anticipated.  I provided  13 minutes of non-face-to-face time Return for depending on labs get fasting labs  and then in person depending  6-12 mos.  Shanon Ace, MD

## 2019-11-16 NOTE — Telephone Encounter (Signed)
Medication Refill - Medication: telmisartan-hydrochlorothiazide (MICARDIS HCT) 40-12.5 MG tablet   Has the patient contacted their pharmacy? Yes.   (Agent: If no, request that the patient contact the pharmacy for the refill.) (Agent: If yes, when and what did the pharmacy advise?)  Preferred Pharmacy (with phone number or street name):  [place], [place] [employer] [facility] Phone:  [phone]  Fax:  [phone]       Agent: Please be advised that RX refills may take up to 3 business days. We ask that you follow-up with your pharmacy.

## 2019-11-16 NOTE — Telephone Encounter (Signed)
Pt has been scheduled for appt to get refilled

## 2019-11-16 NOTE — Telephone Encounter (Signed)
Message Routed to PCP CMA 

## 2020-02-28 DIAGNOSIS — Z20822 Contact with and (suspected) exposure to covid-19: Secondary | ICD-10-CM | POA: Diagnosis not present

## 2020-02-28 DIAGNOSIS — H401132 Primary open-angle glaucoma, bilateral, moderate stage: Secondary | ICD-10-CM | POA: Diagnosis not present

## 2020-02-28 DIAGNOSIS — Z01812 Encounter for preprocedural laboratory examination: Secondary | ICD-10-CM | POA: Diagnosis not present

## 2020-04-24 ENCOUNTER — Other Ambulatory Visit: Payer: Self-pay | Admitting: Internal Medicine

## 2020-07-17 ENCOUNTER — Other Ambulatory Visit: Payer: Self-pay | Admitting: Internal Medicine

## 2020-08-02 ENCOUNTER — Other Ambulatory Visit: Payer: Self-pay | Admitting: Internal Medicine

## 2020-08-03 NOTE — Telephone Encounter (Signed)
Several drug interaction alerts

## 2020-08-05 NOTE — Telephone Encounter (Signed)
Contact patient arrange for fasting labs ( orders in system) Then plan in person visit .  Refill  Medication x 1 after this is arraged   The med warning  donto pertain to her and can be overriddened

## 2020-08-06 NOTE — Telephone Encounter (Signed)
Called patient and she wants to do labs at the time of her visit scheduled on 08/20/2020 at 8:30am. Patient verbalized an understanding.

## 2020-08-14 DIAGNOSIS — H401132 Primary open-angle glaucoma, bilateral, moderate stage: Secondary | ICD-10-CM | POA: Diagnosis not present

## 2020-08-17 NOTE — Progress Notes (Signed)
Chief Complaint  Patient presents with  . Annual Exam  . Medication Management  . Hypertension    HPI: Kristina Davidson 79 y.o. comes in today for Preventive Medicare exam/ wellness visit .Since last visit. No major change in health  Last bp 120/70   At home a few weeks ago  Comes up in office  Take  Med no se reported  Eye  Check   Wanted to do surgery  Opted out.   So far.   Health Maintenance  Topic Date Due  . DEXA SCAN  Never done  . TETANUS/TDAP  11/03/2013  . INFLUENZA VACCINE  Completed  . COVID-19 Vaccine  Completed  . PNA vac Low Risk Adult  Completed   Health Maintenance Review LIFESTYLE:  Exercise:  Cut lawn.   Tobacco/ETS:n Alcohol: n Sugar beverages: grape juice  Sleep: about  7 hours   Drug use: no HH:2  No pets  Daughter     Hearing: ok  Vision:  under glaucoma care . Last eye check UTD  Safety:  Has smoke detector and wears seat belts.  . No excess sun exposure. Sees dentist regularly.  Falls: no  Memory: Felt to be good  , no concern from her or her family.  Depression: No anhedonia unusual crying or depressive symptoms  Nutrition: Eats well balanced diet; adequate calcium and vitamin D. No swallowing chewing problems.  Injury: no major injuries in the last six months.  Other healthcare providers:  Reviewed today .  Preventive parameters:  Reviewed   ADLS:   There are no problems or need for assistance  driving, feeding, obtaining food, dressing, toileting and bathing, managing money using phone. She is independent.     ROS:  GEN/ HEENT: No fever, significant weight changes sweats headaches vision problems hearing changes, CV/ PULM; No chest pain shortness of breath cough, syncope,edema  change in exercise tolerance. GI /GU: No adominal pain, vomiting, change in bowel habits. No blood in the stool. No significant GU symptoms. SKIN/HEME: ,no acute skin rashes suspicious lesions or bleeding. No lymphadenopathy, nodules, masses.  NEURO/  PSYCH:  No neurologic signs such as weakness numbness. No depression anxiety. IMM/ Allergy: No unusual infections.  Allergy .   REST of 12 system review negative except as per HPI   Past Medical History:  Diagnosis Date  . Cardiomyopathy   . Glaucoma    dev vision left eye  . Hyperlipidemia   . Hypertension    lvh mild 2011  . Left bundle branch block   . Varicella   . Vitamin D deficiency     Family History  Problem Relation Age of Onset  . Heart disease Father   . Coronary artery disease Other   . Hypertension Other   . Kidney disease Other     Social History   Socioeconomic History  . Marital status: Married    Spouse name: Not on file  . Number of children: Not on file  . Years of education: Not on file  . Highest education level: Not on file  Occupational History  . Not on file  Tobacco Use  . Smoking status: Never Smoker  . Smokeless tobacco: Never Used  Substance and Sexual Activity  . Alcohol use: No  . Drug use: No  . Sexual activity: Not on file  Other Topics Concern  . Not on file  Social History Narrative   Retired   [employer] of 2   Widowed 2019   Regular  exercise- no   Grandchild with special needs      G3P3   Social Determinants of Health   Financial Resource Strain:   . Difficulty of Paying Living Expenses: Not on file  Food Insecurity:   . Worried About Charity fundraiser in the Last Year: Not on file  . Ran Out of Food in the Last Year: Not on file  Transportation Needs:   . Lack of Transportation (Medical): Not on file  . Lack of Transportation (Non-Medical): Not on file  Physical Activity:   . Days of Exercise per Week: Not on file  . Minutes of Exercise per Session: Not on file  Stress:   . Feeling of Stress : Not on file  Social Connections:   . Frequency of Communication with Friends and Family: Not on file  . Frequency of Social Gatherings with Friends and Family: Not on file  . Attends Religious Services: Not on file  .  Active Member of Clubs or Organizations: Not on file  . Attends Archivist Meetings: Not on file  . Marital Status: Not on file    Outpatient Encounter Medications as of 08/20/2020  Medication Sig  . latanoprost (XALATAN) 0.005 % ophthalmic solution Place 1 drop into both eyes at bedtime as needed.  Vladimir Faster Glycol-Propyl Glycol (SYSTANE) 0.4-0.3 % SOLN Apply to eye. Use as directed   . telmisartan-hydrochlorothiazide (MICARDIS HCT) 40-12.5 MG tablet TAKE 1 TABLET BY MOUTH  DAILY  . [DISCONTINUED] Magnesium 250 MG TABS Take by mouth daily. Reported on 03/18/2016  . [DISCONTINUED] niacin (NIASPAN) 1000 MG CR tablet Take 1 tablet (1,000 mg total) by mouth at bedtime.   No facility-administered encounter medications on file as of 08/20/2020.    EXAM:  BP (!) 150/80 (BP Location: Right Arm, Patient Position: Sitting, Cuff Size: Normal)   Pulse 70   Temp 98 F (36.7 C) (Oral)   Ht 5' 1.75" (1.568 m)   Wt 129 lb 3.2 oz (58.6 kg)   BMI 23.82 kg/m   Body mass index is 23.82 kg/m.  Physical Exam: Vital signs reviewed GUY:QIHK is a well-developed well-nourished alert cooperative   who appears stated age in no acute distress.  HEENT: normocephalic atraumatic , Eyes: PERRL EOM's full,  Nares: paten,t no deformity discharge or tenderness., Ears: no deformity EAC's clear TMs with normal landmarks. Mouthmasked . NECK: supple without masses, thyromegaly or bruits. CHEST/PULM:  Clear to auscultation and percussion breath sounds equal no wheeze , rales or rhonchi. No chest wall deformities or tenderness. CV: PMI is nondisplaced, S1 S2 no gallops, murmurs, rubs. Peripheral pulses are full without delay.No JVD .  Breast: normal by inspection . No dimpling, discharge, masses, tenderness or discharge . ABDOMEN: Bowel sounds normal nontender  No guard or rebound, no hepato splenomegal no CVA tenderness.   Extremtities:  No clubbing cyanosis or edema, no acute joint swelling or redness no  focal atrophy  OA changes hands but nl rom and fucntion  NEURO:  Oriented x3, cranial nerves 3-12 appear to be intact, no obvious focal weakness,gait within normal limits no abnormal reflexes or asymmetrical SKIN: No acute rashes normal turgor, color, no bruising or petechiae. PSYCH: Oriented, good eye contact, no obvious depression anxiety, cognition and judgment appear normal. LN: no cervical axillary inguinal adenopathy No noted deficits in memory, attention, and speech.   Lab Results  Component Value Date   WBC 9.6 09/09/2018   HGB 12.9 09/09/2018   HCT 38.6 09/09/2018  PLT 186.0 09/09/2018   GLUCOSE 84 09/09/2018   CHOL 244 (H) 09/09/2018   TRIG 56.0 09/09/2018   HDL 78.10 09/09/2018   LDLDIRECT 116.3 07/29/2011   LDLCALC 155 (H) 09/09/2018   ALT 12 09/09/2018   AST 19 09/09/2018   NA 135 09/09/2018   K 3.5 09/09/2018   CL 98 09/09/2018   CREATININE 0.95 09/09/2018   BUN 16 09/09/2018   CO2 30 09/09/2018   TSH 3.29 09/09/2018    ASSESSMENT AND PLAN:  Discussed the following assessment and plan:  Visit for preventive health examination - Plan: Lipid panel, Hepatic function panel, CBC with Differential/Platelet, BASIC METABOLIC PANEL WITH GFR, TSH, TSH, BASIC METABOLIC PANEL WITH GFR, CBC with Differential/Platelet, Hepatic function panel, Lipid panel  Essential hypertension - Plan: Lipid panel, Hepatic function panel, CBC with Differential/Platelet, BASIC METABOLIC PANEL WITH GFR, TSH, TSH, BASIC METABOLIC PANEL WITH GFR, CBC with Differential/Platelet, Hepatic function panel, Lipid panel  Medication management - Plan: Lipid panel, Hepatic function panel, CBC with Differential/Platelet, BASIC METABOLIC PANEL WITH GFR, TSH, TSH, BASIC METABOLIC PANEL WITH GFR, CBC with Differential/Platelet, Hepatic function panel, Lipid panel  White coat syndrome with hypertension  Statin intolerance - Plan: Lipid panel, Hepatic function panel, CBC with Differential/Platelet, BASIC  METABOLIC PANEL WITH GFR, TSH, TSH, BASIC METABOLIC PANEL WITH GFR, CBC with Differential/Platelet, Hepatic function panel, Lipid panel  Hyperlipidemia, unspecified hyperlipidemia type - Plan: Lipid panel, Hepatic function panel, CBC with Differential/Platelet, BASIC METABOLIC PANEL WITH GFR, TSH, TSH, BASIC METABOLIC PANEL WITH GFR, CBC with Differential/Platelet, Hepatic function panel, Lipid panel  Hypertensive left ventricular hypertrophy, without heart failure - Plan: Lipid panel, Hepatic function panel, CBC with Differential/Platelet, BASIC METABOLIC PANEL WITH GFR, TSH, TSH, BASIC METABOLIC PANEL WITH GFR, CBC with Differential/Platelet, Hepatic function panel, Lipid panel  Estrogen deficiency - Plan: DG Bone Density  Flu vaccine need - Plan: Flu Vaccine QUAD High Dose(Fluad)  Hand arthritis - functioning   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  Call for appt  At [facility] radiology  [place]  location consider taking  Vit d 1000 iu per day .  Continue weight bearing exercise .   Please bring your blood pressure cuff to next appointment Take blood pressure readings twice a day for 5- 7   days and record .     Take 2 -3 readings at each sitting .   Send in  Readings My chart  Before checking your blood pressure make sure: You are seated and quite for 5 min before checking Feet are flat on the floor Siting in chair with your back supported straight up and down Arm resting on table or arm of chair at heart level Bladder is empty You have NOT had caffeine  or tobacco within the last 30 min If bp ok then we can see you in a year .    Bone Health Bones protect organs, store calcium, anchor muscles, and support the whole body. Keeping your  bones strong is important, especially as you get older. You can take actions to help keep your bones strong and healthy. Why is keeping my bones healthy important?  Keeping your bones healthy is important because your body constantly replaces bone cells. Cells get old, and new cells take their place. As we age, we lose bone cells because the body may not be able to make enough new cells to replace the old cells. The amount of bone cells and bone tissue you have is referred to as bone mass. The higher your bone mass, the stronger your bones. The aging process leads to an overall loss of bone mass in the body, which can increase the likelihood of:  Joint pain and stiffness.  Broken bones.  A condition in which the bones become weak and brittle (osteoporosis). A large decline in bone mass occurs in older adults. In women, it occurs about the time of menopause. What actions can I take to keep my bones healthy? Good health habits are important for maintaining healthy bones. This includes eating nutritious foods and exercising regularly. To have healthy bones, you need to get enough of the right minerals and vitamins. Most nutrition experts recommend getting these nutrients from the foods that you eat. In some cases, taking supplements may also be recommended. Doing certain types of exercise is also important for bone health. What are the nutritional recommendations for healthy bones?  Eating a well-balanced diet with plenty of calcium and vitamin D will help to protect your bones. Nutritional recommendations vary from person to person. Ask your health care provider what is healthy for you. Here are some general guidelines. Get enough calcium Calcium is the most important (essential) mineral for bone health. Most  people can get enough calcium from their diet, but supplements may be recommended for people who are at risk for osteoporosis. Good sources of calcium include:  Dairy products, such as low-fat or nonfat milk, cheese, and yogurt.  Dark green leafy vegetables, such as bok choy and broccoli.  Calcium-fortified foods, such as orange juice, cereal, bread, soy beverages, and tofu products.  Nuts, such as almonds. Follow these recommended amounts for daily calcium intake:  Children, age 30-3: 700 mg.  Children, age 81-8: 1,000 mg.  Children, age 57-13: 1,300 mg.  Teens, age 814-18: 1,300 mg.  Adults, age 21-50: 1,000 mg.  Adults, age 36-70: ? Men: 1,000 mg. ? Women: 1,200 mg.  Adults, age 74 or older: 1,200 mg.  Pregnant and breastfeeding females: ? Teens: 1,300 mg. ? Adults: 1,000 mg. Get enough vitamin D Vitamin D is the most essential vitamin for bone health. It helps the body absorb calcium. Sunlight stimulates the skin to make vitamin D, so be sure to get enough sunlight. If you live in a cold climate or you do not get outside often, your health care provider may recommend that you take vitamin D supplements. Good sources of vitamin D in your diet include:  Egg yolks.  Saltwater fish.  Milk and cereal fortified with vitamin D. Follow these recommended amounts for daily vitamin D intake:  Children and teens, age 30-18: 600 international units.  Adults, age 68 or younger: 400-800 international units.  Adults, age 29 or older: 800-1,000 international units. Get other important nutrients Other nutrients that are important for bone health include:  Phosphorus. This mineral is found in meat, poultry, dairy foods, nuts, and legumes. The recommended daily intake for adult men and adult women is 700 mg.  Magnesium. This mineral  is found in seeds, nuts, dark green vegetables, and legumes. The recommended daily intake for adult men is 400-420 mg. For adult women, it is 310-320  mg.  Vitamin K. This vitamin is found in green leafy vegetables. The recommended daily intake is 120 mg for adult men and 90 mg for adult women. What type of physical activity is best for building and maintaining healthy bones? Weight-bearing and strength-building activities are important for building and maintaining healthy bones. Weight-bearing activities cause muscles and bones to work against gravity. Strength-building activities increase the strength of the muscles that support bones. Weight-bearing and muscle-building activities include:  Walking and hiking.  Jogging and running.  Dancing.  Gym exercises.  Lifting weights.  Tennis and racquetball.  Climbing stairs.  Aerobics. Adults should get at least 30 minutes of moderate physical activity on most days. Children should get at least 60 minutes of moderate physical activity on most days. Ask your health care provider what type of exercise is best for you. How can I find out if my bone mass is low? Bone mass can be measured with an X-ray test called a bone mineral density (BMD) test. This test is recommended for all women who are age 72 or older. It may also be recommended for:  Men who are age 109 or older.  People who are at risk for osteoporosis because of: ? Having bones that break easily. ? Having a long-term disease that weakens bones, such as kidney disease or rheumatoid arthritis. ? Having menopause earlier than normal. ? Taking medicine that weakens bones, such as steroids, thyroid hormones, or hormone treatment for breast cancer or prostate cancer. ? Smoking. ? Drinking three or more alcoholic drinks a day. If you find that you have a low bone mass, you may be able to prevent osteoporosis or further bone loss by changing your diet and lifestyle. Where can I find more information? For more information, check out the following websites:  [place]: AviationTales.fr  [employer]  of Health: www.bones.SouthExposed.es  International Osteoporosis Foundation: Administrator.iofbonehealth.org Summary  The aging process leads to an overall loss of bone mass in the body, which can increase the likelihood of broken bones and osteoporosis.  Eating a well-balanced diet with plenty of calcium and vitamin D will help to protect your bones.  Weight-bearing and strength-building activities are also important for building and maintaining strong bones.  Bone mass can be measured with an X-ray test called a bone mineral density (BMD) test. This information is not intended to replace advice given to you by your health care provider. Make sure you discuss any questions you have with your health care provider. Document Revised: 11/16/2017 Document Reviewed: 11/16/2017 Elsevier Patient Education  2020 [place] Jeidy Hoerner M.D. Record revewi  Had Carotid doppler 2019  Mod obs w/ o plaque left  ica  Right min   Had advised trial zetia not sure  Ever sent in as opted not she had this trial .

## 2020-08-20 ENCOUNTER — Other Ambulatory Visit: Payer: Self-pay

## 2020-08-20 ENCOUNTER — Ambulatory Visit (INDEPENDENT_AMBULATORY_CARE_PROVIDER_SITE_OTHER): Payer: Medicare Other | Admitting: Internal Medicine

## 2020-08-20 ENCOUNTER — Encounter: Payer: Self-pay | Admitting: Internal Medicine

## 2020-08-20 VITALS — BP 150/80 | HR 70 | Temp 98.0°F | Ht 61.75 in | Wt 129.2 lb

## 2020-08-20 DIAGNOSIS — Z0000 Encounter for general adult medical examination without abnormal findings: Secondary | ICD-10-CM | POA: Diagnosis not present

## 2020-08-20 DIAGNOSIS — Z789 Other specified health status: Secondary | ICD-10-CM | POA: Diagnosis not present

## 2020-08-20 DIAGNOSIS — Z79899 Other long term (current) drug therapy: Secondary | ICD-10-CM

## 2020-08-20 DIAGNOSIS — I10 Essential (primary) hypertension: Secondary | ICD-10-CM | POA: Diagnosis not present

## 2020-08-20 DIAGNOSIS — I119 Hypertensive heart disease without heart failure: Secondary | ICD-10-CM

## 2020-08-20 DIAGNOSIS — Z23 Encounter for immunization: Secondary | ICD-10-CM | POA: Diagnosis not present

## 2020-08-20 DIAGNOSIS — E2839 Other primary ovarian failure: Secondary | ICD-10-CM

## 2020-08-20 DIAGNOSIS — E785 Hyperlipidemia, unspecified: Secondary | ICD-10-CM

## 2020-08-20 DIAGNOSIS — M19049 Primary osteoarthritis, unspecified hand: Secondary | ICD-10-CM

## 2020-08-20 DIAGNOSIS — I6522 Occlusion and stenosis of left carotid artery: Secondary | ICD-10-CM

## 2020-08-20 NOTE — Patient Instructions (Addendum)
Order for  dexa scan .  Call for appt  At [facility] radiology  [place]  location consider taking  Vit d 1000 iu per day .  Continue weight bearing exercise .   Please bring your blood pressure cuff to next appointment Take blood pressure readings twice a day for 5- 7   days and record .     Take 2 -3 readings at each sitting .   Send in  Readings My chart  Before checking your blood pressure make sure: You are seated and quite for 5 min before checking Feet are flat on the floor Siting in chair with your back supported straight up and down Arm resting on table or arm of chair at heart level Bladder is empty You have NOT had caffeine or tobacco within the last 30 min If bp ok then we can see you in a year .    Bone Health Bones protect organs, store calcium, anchor muscles, and support the whole body. Keeping your bones strong is important, especially as you get older. You can take actions to help keep your bones strong and healthy. Why is keeping my bones healthy important?  Keeping your bones healthy is important because your body constantly replaces bone cells. Cells get old, and new cells take their place. As we age, we lose bone cells because the body may not be able to make enough new cells to replace the old cells. The amount of bone cells and bone tissue you have is referred to as bone mass. The higher your bone mass, the stronger your bones. The aging process leads to an overall loss of bone mass in the body, which can increase the likelihood of:  Joint pain and stiffness.  Broken bones.  A condition in which the bones become weak and brittle (osteoporosis). A large decline in bone mass occurs in older adults. In women, it occurs about the time of menopause. What actions can I take to keep my bones healthy? Good health habits are important for maintaining healthy bones. This includes eating nutritious foods and exercising regularly. To have healthy bones, you need  to get enough of the right minerals and vitamins. Most nutrition experts recommend getting these nutrients from the foods that you eat. In some cases, taking supplements may also be recommended. Doing certain types of exercise is also important for bone health. What are the nutritional recommendations for healthy bones?  Eating a well-balanced diet with plenty of calcium and vitamin D will help to protect your bones. Nutritional recommendations vary from person to person. Ask your health care provider what is healthy for you. Here are some general guidelines. Get enough calcium Calcium is the most important (essential) mineral for bone health. Most people can get enough calcium from their diet, but supplements may be recommended for people who are at risk for osteoporosis. Good sources of calcium include:  Dairy products, such as low-fat or nonfat milk, cheese, and yogurt.  Dark green leafy vegetables, such as bok choy and broccoli.  Calcium-fortified foods, such as orange juice, cereal, bread, soy beverages, and tofu products.  Nuts, such as almonds. Follow these recommended amounts for daily calcium intake:  Children, age 86-3: 700 mg.  Children, age 23-8: 1,000 mg.  Children, age 233-13: 1,300 mg.  Teens, age 26-18: 1,300 mg.  Adults, age 235-50: 1,000 mg.  Adults, age 8-70: ? Men: 1,000 mg. ? Women: 1,200 mg.  Adults, age 23 or older: 1,200 mg.  Pregnant and breastfeeding females: ? Teens: 1,300 mg. ? Adults: 1,000 mg. Get enough vitamin D Vitamin D is the most essential vitamin for bone health. It helps the body absorb calcium. Sunlight stimulates the skin to make vitamin D, so be sure to get enough sunlight. If you live in a cold climate or you do not get outside often, your health care provider may recommend that you take vitamin D supplements. Good sources of vitamin D in your diet include:  Egg yolks.  Saltwater fish.  Milk and cereal fortified with vitamin D. Follow  these recommended amounts for daily vitamin D intake:  Children and teens, age 16-18: 600 international units.  Adults, age 23 or younger: 400-800 international units.  Adults, age 26 or older: 800-1,000 international units. Get other important nutrients Other nutrients that are important for bone health include:  Phosphorus. This mineral is found in meat, poultry, dairy foods, nuts, and legumes. The recommended daily intake for adult men and adult women is 700 mg.  Magnesium. This mineral is found in seeds, nuts, dark green vegetables, and legumes. The recommended daily intake for adult men is 400-420 mg. For adult women, it is 310-320 mg.  Vitamin K. This vitamin is found in green leafy vegetables. The recommended daily intake is 120 mg for adult men and 90 mg for adult women. What type of physical activity is best for building and maintaining healthy bones? Weight-bearing and strength-building activities are important for building and maintaining healthy bones. Weight-bearing activities cause muscles and bones to work against gravity. Strength-building activities increase the strength of the muscles that support bones. Weight-bearing and muscle-building activities include:  Walking and hiking.  Jogging and running.  Dancing.  Gym exercises.  Lifting weights.  Tennis and racquetball.  Climbing stairs.  Aerobics. Adults should get at least 30 minutes of moderate physical activity on most days. Children should get at least 60 minutes of moderate physical activity on most days. Ask your health care provider what type of exercise is best for you. How can I find out if my bone mass is low? Bone mass can be measured with an X-ray test called a bone mineral density (BMD) test. This test is recommended for all women who are age 33 or older. It may also be recommended for:  Men who are age 57 or older.  People who are at risk for osteoporosis because of: ? Having bones that break  easily. ? Having a long-term disease that weakens bones, such as kidney disease or rheumatoid arthritis. ? Having menopause earlier than normal. ? Taking medicine that weakens bones, such as steroids, thyroid hormones, or hormone treatment for breast cancer or prostate cancer. ? Smoking. ? Drinking three or more alcoholic drinks a day. If you find that you have a low bone mass, you may be able to prevent osteoporosis or further bone loss by changing your diet and lifestyle. Where can I find more information? For more information, check out the following websites:  [place]: AviationTales.fr  [employer] of Health: www.bones.SouthExposed.es  International Osteoporosis Foundation: Administrator.iofbonehealth.org Summary  The aging process leads to an overall loss of bone mass in the body, which can increase the likelihood of broken bones and osteoporosis.  Eating a well-balanced diet with plenty of calcium and vitamin D will help to protect your bones.  Weight-bearing and strength-building activities are also important for building and maintaining strong bones.  Bone mass can be measured with an X-ray test called a bone mineral  density (BMD) test. This information is not intended to replace advice given to you by your health care provider. Make sure you discuss any questions you have with your health care provider. Document Revised: 11/16/2017 Document Reviewed: 11/16/2017 Elsevier Patient Education  2020 [employer].

## 2020-08-21 LAB — BASIC METABOLIC PANEL WITH GFR
BUN/Creatinine Ratio: 18 (calc) (ref 6–22)
BUN: 18 mg/dL (ref 7–25)
CO2: 27 mmol/L (ref 20–32)
Calcium: 9.8 mg/dL (ref 8.6–10.4)
Chloride: 105 mmol/L (ref 98–110)
Creat: 1.01 mg/dL — ABNORMAL HIGH (ref 0.60–0.88)
GFR, Est African American: 60 mL/min/{1.73_m2} (ref >=60)
GFR, Est Non African American: 52 mL/min/{1.73_m2} — ABNORMAL LOW (ref >=60)
Glucose, Bld: 86 mg/dL (ref 65–99)
Potassium: 3.8 mmol/L (ref 3.5–5.3)
Sodium: 142 mmol/L (ref 135–146)

## 2020-08-21 LAB — LIPID PANEL
Cholesterol: 225 mg/dL — ABNORMAL HIGH (ref <200)
HDL: 77 mg/dL (ref >=50)
LDL Cholesterol (Calc): 134 mg/dL (calc) — ABNORMAL HIGH
Non-HDL Cholesterol (Calc): 148 mg/dL (calc) — ABNORMAL HIGH (ref <130)
Total CHOL/HDL Ratio: 2.9 (calc) (ref <5.0)
Triglycerides: 47 mg/dL (ref <150)

## 2020-08-21 LAB — HEPATIC FUNCTION PANEL
AG Ratio: 1.8 (calc) (ref 1.0–2.5)
ALT: 10 U/L (ref 6–29)
AST: 19 U/L (ref 10–35)
Albumin: 4.2 g/dL (ref 3.6–5.1)
Alkaline phosphatase (APISO): 40 U/L (ref 37–153)
Bilirubin, Direct: 0.1 mg/dL (ref 0.0–0.2)
Globulin: 2.3 g/dL (calc) (ref 1.9–3.7)
Indirect Bilirubin: 0.6 mg/dL (calc) (ref 0.2–1.2)
Total Bilirubin: 0.7 mg/dL (ref 0.2–1.2)
Total Protein: 6.5 g/dL (ref 6.1–8.1)

## 2020-08-21 LAB — CBC WITH DIFFERENTIAL/PLATELET
Absolute Monocytes: 286 cells/uL (ref 200–950)
Basophils Absolute: 59 cells/uL (ref 0–200)
Basophils Relative: 0.7 %
Eosinophils Absolute: 101 cells/uL (ref 15–500)
Eosinophils Relative: 1.2 %
HCT: 36 % (ref 35.0–45.0)
Hemoglobin: 11.5 g/dL — ABNORMAL LOW (ref 11.7–15.5)
Lymphs Abs: 5762 cells/uL — ABNORMAL HIGH (ref 850–3900)
MCH: 29 pg (ref 27.0–33.0)
MCHC: 31.9 g/dL — ABNORMAL LOW (ref 32.0–36.0)
MCV: 90.9 fL (ref 80.0–100.0)
MPV: 12.1 fL (ref 7.5–12.5)
Monocytes Relative: 3.4 %
Neutro Abs: 2192 cells/uL (ref 1500–7800)
Neutrophils Relative %: 26.1 %
Platelets: 185 10*3/uL (ref 140–400)
RBC: 3.96 10*6/uL (ref 3.80–5.10)
RDW: 12.7 % (ref 11.0–15.0)
Total Lymphocyte: 68.6 %
WBC: 8.4 10*3/uL (ref 3.8–10.8)

## 2020-08-21 LAB — TSH: TSH: 4.17 mIU/L (ref 0.40–4.50)

## 2020-08-31 NOTE — Progress Notes (Signed)
So as  you can see  results about the same  except  mild  borderline anemia  Please make sure the BP readings are ok  or in range  Consider trying zetia if you want to get the cholesterol down .  In regard to the mild anemia   .  Plan  cbc diff   ferritin ibc  and b12 level in  3 months to recheck ( please place orders and arrange this)  Make sure you are eating a well balanced diet .

## 2020-09-12 ENCOUNTER — Other Ambulatory Visit: Payer: Self-pay

## 2020-09-12 DIAGNOSIS — D649 Anemia, unspecified: Secondary | ICD-10-CM

## 2020-09-13 NOTE — Progress Notes (Signed)
Noted  

## 2020-09-17 ENCOUNTER — Telehealth: Payer: Self-pay | Admitting: Internal Medicine

## 2020-09-17 NOTE — Telephone Encounter (Signed)
Left message for patient to call back and schedule Medicare Annual Wellness Visit (AWV) either virtually or in office.  NO HX ; please schedule at anytime with [facility] Nurse Health Advisor 2.  This should be a 45 minute visit.

## 2020-09-30 ENCOUNTER — Other Ambulatory Visit: Payer: Self-pay | Admitting: Internal Medicine

## 2020-12-14 ENCOUNTER — Telehealth: Payer: Self-pay | Admitting: Internal Medicine

## 2020-12-14 NOTE — Telephone Encounter (Signed)
Left message for patient to call back and schedule Medicare Annual Wellness Visit (AWV) either virtually or in office. Did not leave detailed message  Last AWVI  No information  please schedule at anytime with [facility] Nurse Health Advisor 1 or 2   This should be a 45 minute visit.

## 2021-03-12 ENCOUNTER — Ambulatory Visit: Payer: Medicare Other | Admitting: Internal Medicine

## 2021-04-15 ENCOUNTER — Ambulatory Visit: Payer: Medicare Other

## 2021-04-25 ENCOUNTER — Telehealth: Payer: Self-pay | Admitting: Internal Medicine

## 2021-04-25 NOTE — Telephone Encounter (Signed)
Left message for patient to call back and schedule Medicare Annual Wellness Visit (AWV) either virtually or in office.   awvi per [facility] 11/03/09  please schedule at anytime with [facility] Nurse Health Advisor 1 or 2   This should be a 45 minute visit.

## 2021-04-26 NOTE — Telephone Encounter (Signed)
Pt called the office back and stated at this present time she does not want to schedule.

## 2021-05-06 NOTE — Progress Notes (Signed)
Chief Complaint  Patient presents with   Hamstring pain    Patient complains of Bi-lateral hamstring pain, x1 month, Tried Aleovera Gel and knee brace with little relief    Varicose Veins    Patient complains of varicose veins      HPI: MIESHIA PEPITONE 80 y.o. come in for Chronic disease management  78-month follow-up  BP  pretty good at home   126/66 may have had 1 day where her blood pressure was low but may have taken 2 pills that day.  Had used a pillbox in the past but not currently Last pv 10 21   Eye  due for follow up.  Gets some floaters at times She is still physically active but no longer doing lawn.  Did some box heavy lifting a little bit ago and had bilateral hip radiating down pain from back that is improving about a month ago. She also noticed varicose veins on the left popping out but with no associated pain or swelling is getting less when she laid flat. Today she woke up and has pain behind her right knee and upper calf but no specific injury and can walk normally.   ROS: See pertinent positives and negatives per HPI.  Past Medical History:  Diagnosis Date   Cardiomyopathy    Glaucoma    dev vision left eye   Hyperlipidemia    Hypertension    lvh mild 2011   Left bundle branch block    Varicella    Vitamin D deficiency     Family History  Problem Relation Age of Onset   Heart disease Father    Coronary artery disease Other    Hypertension Other    Kidney disease Other     Social History   Socioeconomic History   Marital status: Married    Spouse name: Not on file   Number of children: Not on file   Years of education: Not on file   Highest education level: Not on file  Occupational History   Not on file  Tobacco Use   Smoking status: Never   Smokeless tobacco: Never  Substance and Sexual Activity   Alcohol use: No   Drug use: No   Sexual activity: Not on file  Other Topics Concern   Not on file  Social History Narrative   Retired    [employer] of 2   Widowed 2019   Regular exercise- no   Grandchild with special needs      G3P3   Social Determinants of Health   Financial Resource Strain: Not on file  Food Insecurity: Not on file  Transportation Needs: Not on file  Physical Activity: Not on file  Stress: Not on file  Social Connections: Not on file    Outpatient Medications Prior to Visit  Medication Sig Dispense Refill   latanoprost (XALATAN) 0.005 % ophthalmic solution Place 1 drop into both eyes at bedtime as needed.  12   Polyethyl Glycol-Propyl Glycol 0.4-0.3 % SOLN Apply to eye. Use as directed      telmisartan-hydrochlorothiazide (MICARDIS HCT) 40-12.5 MG tablet TAKE 1 TABLET BY MOUTH  DAILY 90 tablet 3   No facility-administered medications prior to visit.     EXAM:  BP 140/84 (BP Location: Left Arm, Patient Position: Sitting, Cuff Size: Normal)   Pulse 75   Temp 98.1 F (36.7 C) (Oral)   Ht 5' 1.08" (1.551 m)   Wt 126 lb 3.2 oz (57.2 kg)   SpO2  98%   BMI 23.79 kg/m   Body mass index is 23.79 kg/m.  GENERAL: vitals reviewed and listed above, alert, oriented, appears well hydrated and in no acute distress looks younger than stated age 24: atraumatic, conjunctiva  clear, no obvious abnormalities on inspection of external nose and ears OP : no lesion edema or exudate  NECK: no obvious masses on inspection palpation  LUNGS: clear to auscultation bilaterally, no wheezes, rales or rhonchi, good air movement CV: HRRR, no clubbing cyanosis or  peripheral edema nl cap refill  MS: moves all extremities without noticeable focal  abnormality left lower extremity has some bulging varicose veins up to the thigh with minimal edema no redness or warmth nontender Right leg no prominent varicosities fullness behind the right knee area of discomfort versus hamstring attachment.  But her gait is within normal limits and neurovascular appears intact gait is normal. Skin shows no obvious bruising or  bleeding PSYCH: pleasant and cooperative, no obvious depression or anxiety Lab Results  Component Value Date   WBC 8.4 08/20/2020   HGB 11.5 (L) 08/20/2020   HCT 36.0 08/20/2020   PLT 185 08/20/2020   GLUCOSE 86 08/20/2020   CHOL 225 (H) 08/20/2020   TRIG 47 08/20/2020   HDL 77 08/20/2020   LDLDIRECT 116.3 07/29/2011   LDLCALC 134 (H) 08/20/2020   ALT 10 08/20/2020   AST 19 08/20/2020   NA 142 08/20/2020   K 3.8 08/20/2020   CL 105 08/20/2020   CREATININE 1.01 (H) 08/20/2020   BUN 18 08/20/2020   CO2 27 08/20/2020   TSH 4.17 08/20/2020   BP Readings from Last 3 Encounters:  05/07/21 140/84  08/20/20 (!) 150/80  09/09/18 (!) 152/84    ASSESSMENT AND PLAN:  Discussed the following assessment and plan:  Essential hypertension - at goal at home  - Plan: Basic metabolic panel, CBC with Differential/Platelet, CBC with Differential/Platelet, Basic metabolic panel  Medication management - Plan: Basic metabolic panel, CBC with Differential/Platelet, CBC with Differential/Platelet, Basic metabolic panel  White coat syndrome with hypertension  Hypertensive left ventricular hypertrophy, without heart failure  Hyperlipidemia, unspecified hyperlipidemia type  Mild anemia - Plan: Basic metabolic panel, CBC with Differential/Platelet, CBC with Differential/Platelet, Basic metabolic panel  Pain of right lower extremity - Plan: Basic metabolic panel, CBC with Differential/Platelet, CBC with Differential/Platelet, Basic metabolic panel  Asymptomatic varicose veins of left lower extremity Discussion about following alarm symptoms she denies any symptoms or swelling in the left lower extremity where the varicosities are obvious. If developing more difficulty plan follow-up contact In regard to her right lower extremity this could be knee related question popliteal cyst versus a strain. No alarming findings on exam if persistent progressive we will do more evaluation and  follow-up.  In regard to blood pressure readings at home they appear to be controlled her white coat effect is not severe today Monitoring avoid dehydration we can cut back on blood pressure dosing if needed. Plan CPX in late October early November. -Patient advised to return or notify health care team  if  new concerns arise. Record review time evaluation new problem all problems 34 minutes. Patient Instructions  BP is good today . Get a pill box for reasons discussed and stay hydrated .   The right leg pain may be related to knee    if  ongoing for weeks then let us know and can get referral.  No heavy lifting  Let us know if  getting pain swelling in  the left leg.   Will notify you  of labs when available.  If all ok then can do  cpx in the fall October  November    Merilee Wible K. Arland Usery M.D.

## 2021-05-07 ENCOUNTER — Ambulatory Visit (INDEPENDENT_AMBULATORY_CARE_PROVIDER_SITE_OTHER): Payer: Medicare Other | Admitting: Internal Medicine

## 2021-05-07 ENCOUNTER — Other Ambulatory Visit: Payer: Self-pay

## 2021-05-07 ENCOUNTER — Encounter: Payer: Self-pay | Admitting: Internal Medicine

## 2021-05-07 VITALS — BP 140/84 | HR 75 | Temp 98.1°F | Ht 61.08 in | Wt 126.2 lb

## 2021-05-07 DIAGNOSIS — D649 Anemia, unspecified: Secondary | ICD-10-CM

## 2021-05-07 DIAGNOSIS — I8392 Asymptomatic varicose veins of left lower extremity: Secondary | ICD-10-CM

## 2021-05-07 DIAGNOSIS — I10 Essential (primary) hypertension: Secondary | ICD-10-CM | POA: Diagnosis not present

## 2021-05-07 DIAGNOSIS — M79604 Pain in right leg: Secondary | ICD-10-CM | POA: Diagnosis not present

## 2021-05-07 DIAGNOSIS — E785 Hyperlipidemia, unspecified: Secondary | ICD-10-CM | POA: Diagnosis not present

## 2021-05-07 DIAGNOSIS — Z79899 Other long term (current) drug therapy: Secondary | ICD-10-CM

## 2021-05-07 DIAGNOSIS — I119 Hypertensive heart disease without heart failure: Secondary | ICD-10-CM

## 2021-05-07 LAB — CBC WITH DIFFERENTIAL/PLATELET
Basophils Absolute: 0 10*3/uL (ref 0.0–0.1)
Basophils Relative: 0.3 % (ref 0.0–3.0)
Eosinophils Absolute: 0.2 10*3/uL (ref 0.0–0.7)
Eosinophils Relative: 1.5 % (ref 0.0–5.0)
HCT: 34.4 % — ABNORMAL LOW (ref 36.0–46.0)
Hemoglobin: 11.7 g/dL — ABNORMAL LOW (ref 12.0–15.0)
Lymphocytes Relative: 60.1 % — ABNORMAL HIGH (ref 12.0–46.0)
Lymphs Abs: 6.6 10*3/uL — ABNORMAL HIGH (ref 0.7–4.0)
MCHC: 33.9 g/dL (ref 30.0–36.0)
MCV: 86.9 fl (ref 78.0–100.0)
Monocytes Absolute: 0.4 10*3/uL (ref 0.1–1.0)
Monocytes Relative: 3.7 % (ref 3.0–12.0)
Neutro Abs: 3.8 10*3/uL (ref 1.4–7.7)
Neutrophils Relative %: 34.4 % — ABNORMAL LOW (ref 43.0–77.0)
Platelets: 372 10*3/uL (ref 150.0–400.0)
RBC: 3.96 Mil/uL (ref 3.87–5.11)
RDW: 13.5 % (ref 11.5–15.5)
WBC: 10.9 10*3/uL — ABNORMAL HIGH (ref 4.0–10.5)

## 2021-05-07 LAB — BASIC METABOLIC PANEL
BUN: 19 mg/dL (ref 6–23)
CO2: 29 mEq/L (ref 19–32)
Calcium: 9.7 mg/dL (ref 8.4–10.5)
Chloride: 101 mEq/L (ref 96–112)
Creatinine, Ser: 1.19 mg/dL (ref 0.40–1.20)
GFR: 42.67 mL/min — ABNORMAL LOW (ref >60.00)
Glucose, Bld: 86 mg/dL (ref 70–99)
Potassium: 3.9 mEq/L (ref 3.5–5.1)
Sodium: 138 mEq/L (ref 135–145)

## 2021-05-07 NOTE — Patient Instructions (Signed)
BP is good today . Get a pill box for reasons discussed and stay hydrated .   The right leg pain may be related to knee    if  ongoing for weeks then let us know and can get referral.  No heavy lifting  Let us know if  getting pain swelling in the left leg.   Will notify you  of labs when available.  If all ok then can do  cpx in the fall October  November

## 2021-05-10 NOTE — Progress Notes (Signed)
Borderline  Anemia stable  slightly better . Plan repeat  in 6 months    at your next visit

## 2021-05-27 ENCOUNTER — Telehealth: Payer: Self-pay | Admitting: Internal Medicine

## 2021-05-27 NOTE — Telephone Encounter (Signed)
Left message for patient to call back and schedule Medicare Annual Wellness Visit (AWV) either virtually or in office.   awvi per [facility] 11/03/09   please schedule at anytime with [facility] Nurse Health Advisor 1 or 2   This should be a 45 minute visit.

## 2021-06-06 ENCOUNTER — Ambulatory Visit (INDEPENDENT_AMBULATORY_CARE_PROVIDER_SITE_OTHER): Payer: Medicare Other

## 2021-06-06 DIAGNOSIS — Z0000 Encounter for general adult medical examination without abnormal findings: Secondary | ICD-10-CM | POA: Diagnosis not present

## 2021-06-06 NOTE — Progress Notes (Signed)
Subjective:   Kristina Davidson is a 80 y.o. female who presents for Medicare Annual (Subsequent) preventive examination.   I connected with Kristina Davidson today by telephone and verified that I am speaking with the correct person using two identifiers. Location patient: home Location provider: work Persons participating in the virtual visit: patient, provider.   I discussed the limitations, risks, security and privacy concerns of performing an evaluation and management service by telephone and the availability of in person appointments. I also discussed with the patient that there may be a patient responsible charge related to this service. The patient expressed understanding and verbally consented to this telephonic visit.    Interactive audio and video telecommunications were attempted between this provider and patient, however failed, due to patient having technical difficulties OR patient did not have access to video capability.  We continued and completed visit with audio only.    Review of Systems    N/a       Objective:    There were no vitals filed for this visit. There is no height or weight on file to calculate BMI.  Advanced Directives 03/18/2016  Does Patient Have a Medical Advance Directive? No  Would patient like information on creating a medical advance directive? No - patient declined information    Current Medications (verified) Outpatient Encounter Medications as of 06/06/2021  Medication Sig   latanoprost (XALATAN) 0.005 % ophthalmic solution Place 1 drop into both eyes at bedtime as needed.   Polyethyl Glycol-Propyl Glycol 0.4-0.3 % SOLN Apply to eye. Use as directed    telmisartan-hydrochlorothiazide (MICARDIS HCT) 40-12.5 MG tablet TAKE 1 TABLET BY MOUTH  DAILY   No facility-administered encounter medications on file as of 06/06/2021.    Allergies (verified) Pravachol [pravastatin sodium], Ace inhibitors, Brimonidine tartrate, Dorzolamide hcl-timolol mal,  Lisinopril, Pravastatin, and Pseudoephedrine   History: Past Medical History:  Diagnosis Date   Cardiomyopathy    Glaucoma    dev vision left eye   Hyperlipidemia    Hypertension    lvh mild 2011   Left bundle branch block    Varicella    Vitamin D deficiency    Past Surgical History:  Procedure Laterality Date   ABDOMINAL HYSTERECTOMY     APPENDECTOMY     EYE SURGERY     glaucoma.cataract   Family History  Problem Relation Age of Onset   Heart disease Father    Coronary artery disease Other    Hypertension Other    Kidney disease Other    Social History   Socioeconomic History   Marital status: Married    Spouse name: Not on file   Number of children: Not on file   Years of education: Not on file   Highest education level: Not on file  Occupational History   Not on file  Tobacco Use   Smoking status: Never   Smokeless tobacco: Never  Substance and Sexual Activity   Alcohol use: No   Drug use: No   Sexual activity: Not on file  Other Topics Concern   Not on file  Social History Narrative   Retired   [employer] of 2   Widowed 2019   Regular exercise- no   Grandchild with special needs      G3P3   Social Determinants of Health   Financial Resource Strain: Not on file  Food Insecurity: Not on file  Transportation Needs: Not on file  Physical Activity: Not on file  Stress: Not on file  Social Connections: Not on file    Tobacco Counseling Counseling given: Not Answered   Clinical Intake:                 Diabetic?no         Activities of Daily Living No flowsheet data found.  Patient Care Team: Panosh, Standley Brooking, MD as PCP - General Stanford Breed, Denice Bors, MD (Cardiology) Verneita Griffes, MD (Ophthalmology)  Indicate any recent Medical Services you may have received from other than [facility] providers in the past year (date may be approximate).     Assessment:   This is a routine wellness examination for Kristina Davidson.  Hearing/Vision screen No  results found.  Dietary issues and exercise activities discussed:     Goals Addressed   None    Depression Screen PHQ 2/9 Scores 08/20/2020 09/09/2018 03/18/2016 05/26/2013  PHQ - 2 Score 0 0 0 0    Fall Risk Fall Risk  08/20/2020 09/09/2018 03/17/2017 03/18/2016 05/26/2013  Falls in the past year? 0 0 Yes Yes No  Number falls in past yr: 0 - 1 1 -  Injury with Fall? 0 - No No -    FALL RISK PREVENTION PERTAINING TO THE HOME:  Any stairs in or around the home? No  If so, are there any without handrails? No  Home free of loose throw rugs in walkways, pet beds, electrical cords, etc? Yes  Adequate lighting in your home to reduce risk of falls? Yes   ASSISTIVE DEVICES UTILIZED TO PREVENT FALLS:  Life alert? No  Use of a cane, walker or w/c? No  Grab bars in the bathroom? No  Shower chair or bench in shower? No  Elevated toilet seat or a handicapped toilet? No    Cognitive Function:    Normal cognitive status assessed by direct observation by this Nurse Health Advisor. No abnormalities found.      Immunizations Immunization History  Administered Date(s) Administered   Fluad Quad(high Dose 65+) 08/20/2020   Influenza Split 08/25/2011, 08/25/2012   Influenza Whole 08/28/2009, 08/05/2010   Influenza, High Dose Seasonal PF 08/24/2013, 11/01/2014, 08/09/2015, 07/29/2016, 07/28/2017, 09/09/2018   PFIZER(Purple Top)SARS-COV-2 Vaccination 01/17/2020, 02/07/2020   Pneumococcal Conjugate-13 08/09/2015   Pneumococcal Polysaccharide-23 11/04/2006, 03/26/2009   Td 11/04/2003    TDAP status: Due, Education has been provided regarding the importance of this vaccine. Advised may receive this vaccine at local pharmacy or Health Dept. Aware to provide a copy of the vaccination record if obtained from local pharmacy or Health Dept. Verbalized acceptance and understanding.  Flu Vaccine status: Up to date  Pneumococcal vaccine status: Up to date  Covid-19 vaccine status: Completed  vaccines  Qualifies for Shingles Vaccine? Yes   Zostavax completed No   Shingrix Completed?: No.    Education has been provided regarding the importance of this vaccine. Patient has been advised to call insurance company to determine out of pocket expense if they have not yet received this vaccine. Advised may also receive vaccine at local pharmacy or Health Dept. Verbalized acceptance and understanding.  Screening Tests Health Maintenance  Topic Date Due   Zoster Vaccines- Shingrix (1 of 2) Never done   DEXA SCAN  Never done   TETANUS/TDAP  11/03/2013   COVID-19 Vaccine (3 - Pfizer risk series) 03/06/2020   INFLUENZA VACCINE  06/03/2021   PNA vac Low Risk Adult  Completed   HPV VACCINES  Aged Out    Health Maintenance  Health Maintenance Due  Topic Date Due  Zoster Vaccines- Shingrix (1 of 2) Never done   DEXA SCAN  Never done   TETANUS/TDAP  11/03/2013   COVID-19 Vaccine (3 - Pfizer risk series) 03/06/2020   INFLUENZA VACCINE  06/03/2021    Colorectal cancer screening: No longer required.   Mammogram status: No longer required due to age.  Bone Density status: Ordered 08/20/2020. Pt provided with contact info and advised to call to schedule appt.  Lung Cancer Screening: (Low Dose CT Chest recommended if Age 100-80 years, 30 pack-year currently smoking OR have quit w/in 15years.) does not qualify.   Lung Cancer Screening Referral: n/a  Additional Screening:  Hepatitis C Screening: does not qualify;   Vision Screening: Recommended annual ophthalmology exams for early detection of glaucoma and other disorders of the eye. Is the patient up to date with their annual eye exam?  Yes  Who is the provider or what is the name of the office in which the patient attends annual eye exams? [facility]  If pt is not established with a provider, would they like to be referred to a provider to establish care? No .   Dental Screening: Recommended annual dental exams for proper  oral hygiene  Community Resource Referral / Chronic Care Management: CRR required this visit?  No   CCM required this visit?  No      Plan:     I have personally reviewed and noted the following in the patient's chart:   Medical and social history Use of alcohol, tobacco or illicit drugs  Current medications and supplements including opioid prescriptions.  Functional ability and status Nutritional status Physical activity Advanced directives List of other physicians Hospitalizations, surgeries, and ER visits in previous 12 months Vitals Screenings to include cognitive, depression, and falls Referrals and appointments  In addition, I have reviewed and discussed with patient certain preventive protocols, quality metrics, and best practice recommendations. A written personalized care plan for preventive services as well as general preventive health recommendations were provided to patient.     Randel Pigg, LPN   [id number]   Nurse Notes: none

## 2021-06-06 NOTE — Patient Instructions (Signed)
Kristina Davidson , Thank you for taking time to come for your Medicare Wellness Visit. I appreciate your ongoing commitment to your health goals. Please review the following plan we discussed and let me know if I can assist you in the future.   Screening recommendations/referrals: Colonoscopy: no longer required  Mammogram: no longer required  Bone Density: referral 08/20/2020 Recommended yearly ophthalmology/optometry visit for glaucoma screening and checkup Recommended yearly dental visit for hygiene and checkup  Vaccinations: Influenza vaccine: due in fall 2022  Pneumococcal vaccine: completed series  Tdap vaccine: due upon injury  Shingles vaccine: will obtain local pharmacy     Advanced directives: will provide copies   Conditions/risks identified: none   Next appointment: none    Preventive Care 33 Years and Older, Female Preventive care refers to lifestyle choices and visits with your health care provider that can promote health and wellness. What does preventive care include? A yearly physical exam. This is also called an annual well check. Dental exams once or twice a year. Routine eye exams. Ask your health care provider how often you should have your eyes checked. Personal lifestyle choices, including: Daily care of your teeth and gums. Regular physical activity. Eating a healthy diet. Avoiding tobacco and drug use. Limiting alcohol use. Practicing safe sex. Taking low-dose aspirin every day. Taking vitamin and mineral supplements as recommended by your health care provider. What happens during an annual well check? The services and screenings done by your health care provider during your annual well check will depend on your age, overall health, lifestyle risk factors, and family history of disease. Counseling  Your health care provider may ask you questions about your: Alcohol use. Tobacco use. Drug use. Emotional well-being. Home and relationship  well-being. Sexual activity. Eating habits. History of falls. Memory and ability to understand (cognition). Work and work Statistician. Reproductive health. Screening  You may have the following tests or measurements: Height, weight, and BMI. Blood pressure. Lipid and cholesterol levels. These may be checked every 5 years, or more frequently if you are over 31 years old. Skin check. Lung cancer screening. You may have this screening every year starting at age 66 if you have a 30-pack-year history of smoking and currently smoke or have quit within the past 15 years. Fecal occult blood test (FOBT) of the stool. You may have this test every year starting at age 51. Flexible sigmoidoscopy or colonoscopy. You may have a sigmoidoscopy every 5 years or a colonoscopy every 10 years starting at age 38. Hepatitis C blood test. Hepatitis B blood test. Sexually transmitted disease (STD) testing. Diabetes screening. This is done by checking your blood sugar (glucose) after you have not eaten for a while (fasting). You may have this done every 1-3 years. Bone density scan. This is done to screen for osteoporosis. You may have this done starting at age 51. Mammogram. This may be done every 1-2 years. Talk to your health care provider about how often you should have regular mammograms. Talk with your health care provider about your test results, treatment options, and if necessary, the need for more tests. Vaccines  Your health care provider may recommend certain vaccines, such as: Influenza vaccine. This is recommended every year. Tetanus, diphtheria, and acellular pertussis (Tdap, Td) vaccine. You may need a Td booster every 10 years. Zoster vaccine. You may need this after age 37. Pneumococcal 13-valent conjugate (PCV13) vaccine. One dose is recommended after age 51. Pneumococcal polysaccharide (PPSV23) vaccine. One dose is recommended after  age 50. Talk to your health care provider about which  screenings and vaccines you need and how often you need them. This information is not intended to replace advice given to you by your health care provider. Make sure you discuss any questions you have with your health care provider. Document Released: 11/16/2015 Document Revised: 07/09/2016 Document Reviewed: 08/21/2015 Elsevier Interactive Patient Education  2017 [place] Prevention in the Home Falls can cause injuries. They can happen to people of all ages. There are many things you can do to make your home safe and to help prevent falls. What can I do on the outside of my home? Regularly fix the edges of walkways and driveways and fix any cracks. Remove anything that might make you trip as you walk through a door, such as a raised step or threshold. Trim any bushes or trees on the path to your home. Use bright outdoor lighting. Clear any walking paths of anything that might make someone trip, such as rocks or tools. Regularly check to see if handrails are loose or broken. Make sure that both sides of any steps have handrails. Any raised decks and porches should have guardrails on the edges. Have any leaves, snow, or ice cleared regularly. Use sand or salt on walking paths during winter. Clean up any spills in your garage right away. This includes oil or grease spills. What can I do in the bathroom? Use night lights. Install grab bars by the toilet and in the tub and shower. Do not use towel bars as grab bars. Use non-skid mats or decals in the tub or shower. If you need to sit down in the shower, use a plastic, non-slip stool. Keep the floor dry. Clean up any water that spills on the floor as soon as it happens. Remove soap buildup in the tub or shower regularly. Attach bath mats securely with double-sided non-slip rug tape. Do not have throw rugs and other things on the floor that can make you trip. What can I do in the bedroom? Use night lights. Make sure that you have a  light by your bed that is easy to reach. Do not use any sheets or blankets that are too big for your bed. They should not hang down onto the floor. Have a firm chair that has side arms. You can use this for support while you get dressed. Do not have throw rugs and other things on the floor that can make you trip. What can I do in the kitchen? Clean up any spills right away. Avoid walking on wet floors. Keep items that you use a lot in easy-to-reach places. If you need to reach something above you, use a strong step stool that has a grab bar. Keep electrical cords out of the way. Do not use floor polish or wax that makes floors slippery. If you must use wax, use non-skid floor wax. Do not have throw rugs and other things on the floor that can make you trip. What can I do with my stairs? Do not leave any items on the stairs. Make sure that there are handrails on both sides of the stairs and use them. Fix handrails that are broken or loose. Make sure that handrails are as long as the stairways. Check any carpeting to make sure that it is firmly attached to the stairs. Fix any carpet that is loose or worn. Avoid having throw rugs at the top or bottom of the stairs. If you do  have throw rugs, attach them to the floor with carpet tape. Make sure that you have a light switch at the top of the stairs and the bottom of the stairs. If you do not have them, ask someone to add them for you. What else can I do to help prevent falls? Wear shoes that: Do not have high heels. Have rubber bottoms. Are comfortable and fit you well. Are closed at the toe. Do not wear sandals. If you use a stepladder: Make sure that it is fully opened. Do not climb a closed stepladder. Make sure that both sides of the stepladder are locked into place. Ask someone to hold it for you, if possible. Clearly mark and make sure that you can see: Any grab bars or handrails. First and last steps. Where the edge of each step  is. Use tools that help you move around (mobility aids) if they are needed. These include: Canes. Walkers. Scooters. Crutches. Turn on the lights when you go into a dark area. Replace any light bulbs as soon as they burn out. Set up your furniture so you have a clear path. Avoid moving your furniture around. If any of your floors are uneven, fix them. If there are any pets around you, be aware of where they are. Review your medicines with your doctor. Some medicines can make you feel dizzy. This can increase your chance of falling. Ask your doctor what other things that you can do to help prevent falls. This information is not intended to replace advice given to you by your health care provider. Make sure you discuss any questions you have with your health care provider. Document Released: 08/16/2009 Document Revised: 03/27/2016 Document Reviewed: 11/24/2014 Elsevier Interactive Patient Education  2017 [employer].

## 2021-09-04 ENCOUNTER — Ambulatory Visit (INDEPENDENT_AMBULATORY_CARE_PROVIDER_SITE_OTHER): Payer: Medicare Other | Admitting: Internal Medicine

## 2021-09-04 ENCOUNTER — Other Ambulatory Visit: Payer: Self-pay

## 2021-09-04 ENCOUNTER — Encounter: Payer: Self-pay | Admitting: Internal Medicine

## 2021-09-04 ENCOUNTER — Other Ambulatory Visit: Payer: Medicare Other

## 2021-09-04 VITALS — BP 166/80 | HR 69 | Temp 98.0°F | Ht 61.0 in | Wt 130.4 lb

## 2021-09-04 DIAGNOSIS — Z23 Encounter for immunization: Secondary | ICD-10-CM | POA: Diagnosis not present

## 2021-09-04 DIAGNOSIS — I6522 Occlusion and stenosis of left carotid artery: Secondary | ICD-10-CM

## 2021-09-04 DIAGNOSIS — R7989 Other specified abnormal findings of blood chemistry: Secondary | ICD-10-CM

## 2021-09-04 DIAGNOSIS — I10 Essential (primary) hypertension: Secondary | ICD-10-CM

## 2021-09-04 DIAGNOSIS — E2839 Other primary ovarian failure: Secondary | ICD-10-CM

## 2021-09-04 DIAGNOSIS — D649 Anemia, unspecified: Secondary | ICD-10-CM

## 2021-09-04 DIAGNOSIS — Z0000 Encounter for general adult medical examination without abnormal findings: Secondary | ICD-10-CM | POA: Diagnosis not present

## 2021-09-04 DIAGNOSIS — Z79899 Other long term (current) drug therapy: Secondary | ICD-10-CM

## 2021-09-04 DIAGNOSIS — Z789 Other specified health status: Secondary | ICD-10-CM

## 2021-09-04 DIAGNOSIS — D72820 Lymphocytosis (symptomatic): Secondary | ICD-10-CM | POA: Diagnosis not present

## 2021-09-04 DIAGNOSIS — H409 Unspecified glaucoma: Secondary | ICD-10-CM

## 2021-09-04 LAB — TSH: TSH: 4.03 u[IU]/mL (ref 0.35–5.50)

## 2021-09-04 LAB — CBC WITH DIFFERENTIAL/PLATELET
Basophils Absolute: 0 10*3/uL (ref 0.0–0.1)
Basophils Relative: 0.4 % (ref 0.0–3.0)
Eosinophils Absolute: 0.1 10*3/uL (ref 0.0–0.7)
Eosinophils Relative: 1.3 % (ref 0.0–5.0)
HCT: 35.5 % — ABNORMAL LOW (ref 36.0–46.0)
Hemoglobin: 11.7 g/dL — ABNORMAL LOW (ref 12.0–15.0)
Lymphocytes Relative: 66.7 % — ABNORMAL HIGH (ref 12.0–46.0)
Lymphs Abs: 6.6 10*3/uL — ABNORMAL HIGH (ref 0.7–4.0)
MCHC: 32.9 g/dL (ref 30.0–36.0)
MCV: 89.3 fl (ref 78.0–100.0)
Monocytes Absolute: 0.5 10*3/uL (ref 0.1–1.0)
Monocytes Relative: 4.6 % (ref 3.0–12.0)
Neutro Abs: 2.7 10*3/uL (ref 1.4–7.7)
Neutrophils Relative %: 27 % — ABNORMAL LOW (ref 43.0–77.0)
Platelets: 237 10*3/uL (ref 150.0–400.0)
RBC: 3.98 Mil/uL (ref 3.87–5.11)
RDW: 14.2 % (ref 11.5–15.5)
WBC: 9.9 10*3/uL (ref 4.0–10.5)

## 2021-09-04 LAB — HEPATIC FUNCTION PANEL
ALT: 14 U/L (ref 0–35)
AST: 22 U/L (ref 0–37)
Albumin: 4.4 g/dL (ref 3.5–5.2)
Alkaline Phosphatase: 39 U/L (ref 39–117)
Bilirubin, Direct: 0.1 mg/dL (ref 0.0–0.3)
Total Bilirubin: 0.5 mg/dL (ref 0.2–1.2)
Total Protein: 7.2 g/dL (ref 6.0–8.3)

## 2021-09-04 LAB — BASIC METABOLIC PANEL
BUN: 21 mg/dL (ref 6–23)
CO2: 31 mEq/L (ref 19–32)
Calcium: 9.8 mg/dL (ref 8.4–10.5)
Chloride: 102 mEq/L (ref 96–112)
Creatinine, Ser: 1.07 mg/dL (ref 0.40–1.20)
GFR: 48.36 mL/min — ABNORMAL LOW (ref >60.00)
Glucose, Bld: 80 mg/dL (ref 70–99)
Potassium: 3.8 mEq/L (ref 3.5–5.1)
Sodium: 139 mEq/L (ref 135–145)

## 2021-09-04 LAB — LIPID PANEL
Cholesterol: 256 mg/dL — ABNORMAL HIGH (ref 0–200)
HDL: 82.6 mg/dL (ref >39.00)
LDL Cholesterol: 163 mg/dL — ABNORMAL HIGH (ref 0–99)
NonHDL: 173.06
Total CHOL/HDL Ratio: 3
Triglycerides: 52 mg/dL (ref 0.0–149.0)
VLDL: 10.4 mg/dL (ref 0.0–40.0)

## 2021-09-04 LAB — IBC PANEL
Iron: 77 ug/dL (ref 42–145)
Saturation Ratios: 21.3 % (ref 20.0–50.0)
TIBC: 361.2 ug/dL (ref 250.0–450.0)
Transferrin: 258 mg/dL (ref 212.0–360.0)

## 2021-09-04 LAB — VITAMIN B12: Vitamin B-12: 346 pg/mL (ref 211–911)

## 2021-09-04 NOTE — Patient Instructions (Addendum)
Good to see you today . Keep iup with BP readings  once a month  goal below 140/90  130/80 is great.   Same medicaiton Will notify labs results when available.  Flu vaccine today   Can get a shingles vaccine  when convenient at your pharmacy.   Consider dexa or bone density to check for osteoporosis risk of fracture    I can order this at [facility]ology [facility]   you call for appt  when convenient .    Bone Health Bones protect organs, store calcium, anchor muscles, and support the whole body. Keeping your bones strong is important, especially as you get older. You can take actions to help keep your bones strong and healthy. Why is keeping my bones healthy important? Keeping your bones healthy is important because your body constantly replaces bone cells. Cells get old, and new cells take their place. As we age, we lose bone cells because the body may not be able to make enough new cells to replace the old cells. The amount of bone cells and bone tissue you have is referred to as bone mass. The higher your bone mass, the stronger your bones. The aging process leads to an overall loss of bone mass in the body, which can increase the likelihood of: Joint pain and stiffness. Broken bones. A condition in which the bones become weak and brittle (osteoporosis). A large decline in bone mass occurs in older adults. In women, it occurs about the time of menopause. What actions can I take to keep my bones healthy? Good health habits are important for maintaining healthy bones. This includes eating nutritious foods and exercising regularly. To have healthy bones, you need to get enough of the right minerals and vitamins. Most nutrition experts recommend getting these nutrients from the foods that you eat. In some cases, taking supplements may also be recommended. Doing certain types of exercise is also important for bone health. What are the nutritional recommendations for healthy bones? Eating a  well-balanced diet with plenty of calcium and vitamin D will help to protect your bones. Nutritional recommendations vary from person to person. Ask your health care provider what is healthy for you. Here are some general guidelines. Get enough calcium Calcium is the most important (essential) mineral for bone health. Most people can get enough calcium from their diet, but supplements may be recommended for people who are at risk for osteoporosis. Good sources of calcium include: Dairy products, such as low-fat or nonfat milk, cheese, and yogurt. Dark green leafy vegetables, such as bok choy and broccoli. Calcium-fortified foods, such as orange juice, cereal, bread, soy beverages, and tofu products. Nuts, such as almonds. Follow these recommended amounts for daily calcium intake: Children, age 32-3: 700 mg. Children, age 79-8: 1,000 mg. Children, age 792-13: 1,300 mg. Teens, age 51-18: 1,300 mg. Adults, age 59-50: 1,000 mg. Adults, age 37-70: Men: 1,000 mg. Women: 1,200 mg. Adults, age 39 or older: 1,200 mg. Pregnant and breastfeeding females: Teens: 1,300 mg. Adults: 1,000 mg. Get enough vitamin D Vitamin D is the most essential vitamin for bone health. It helps the body absorb calcium. Sunlight stimulates the skin to make vitamin D, so be sure to get enough sunlight. If you live in a cold climate or you do not get outside often, your health care provider may recommend that you take vitamin D supplements. Good sources of vitamin D in your diet include: Egg yolks. Saltwater fish. Milk and cereal fortified with vitamin D.  Follow these recommended amounts for daily vitamin D intake: Children and teens, age 69-18: 600 international units. Adults, age 74 or younger: 400-800 international units. Adults, age 48 or older: 800-1,000 international units. Get other important nutrients Other nutrients that are important for bone health include: Phosphorus. This mineral is found in meat, poultry, dairy  foods, nuts, and legumes. The recommended daily intake for adult men and adult women is 700 mg. Magnesium. This mineral is found in seeds, nuts, dark green vegetables, and legumes. The recommended daily intake for adult men is 400-420 mg. For adult women, it is 310-320 mg. Vitamin K. This vitamin is found in green leafy vegetables. The recommended daily intake is 120 mg for adult men and 90 mg for adult women. What type of physical activity is best for building and maintaining healthy bones? Weight-bearing and strength-building activities are important for building and maintaining healthy bones. Weight-bearing activities cause muscles and bones to work against gravity. Strength-building activities increase the strength of the muscles that support bones. Weight-bearing and muscle-building activities include: Walking and hiking. Jogging and running. Dancing. Gym exercises. Lifting weights. Tennis and racquetball. Climbing stairs. Aerobics. Adults should get at least 30 minutes of moderate physical activity on most days. Children should get at least 60 minutes of moderate physical activity on most days. Ask your health care provider what type of exercise is best for you. How can I find out if my bone mass is low? Bone mass can be measured with an X-ray test called a bone mineral density (BMD) test. This test is recommended for all women who are age 35 or older. It may also be recommended for: Men who are age 46 or older. People who are at risk for osteoporosis because of: Having bones that break easily. Having a long-term disease that weakens bones, such as kidney disease or rheumatoid arthritis. Having menopause earlier than normal. Taking medicine that weakens bones, such as steroids, thyroid hormones, or hormone treatment for breast cancer or prostate cancer. Smoking. Drinking three or more alcoholic drinks a day. If you find that you have a low bone mass, you may be able to prevent  osteoporosis or further bone loss by changing your diet and lifestyle. Where can I find more information? For more information, check out the following websites: [place]: [employer] of Health: www.bones.[employer]Ex[employer].es International Osteoporosis Foundation: Administrator.iofbonehealth.org Summary The aging process leads to an overall loss of bone mass in the body, which can increase the likelihood of broken bones and osteoporosis. Eating a well-balanced diet with plenty of calcium and vitamin D will help to protect your bones. Weight-bearing and strength-building activities are also important for building and maintaining strong bones. Bone mass can be measured with an X-ray test called a bone mineral density (BMD) test. This information is not intended to replace advice given to you by your health care provider. Make sure you discuss any questions you have with your health care provider. Document Revised: 11/16/2017 Document Reviewed: 11/16/2017 Elsevier Patient Education  2022 [employer].

## 2021-09-04 NOTE — Progress Notes (Signed)
Chief Complaint  Patient presents with   Annual Exam    HPI: Kristina Davidson 80 y.o. comes in today for Preventive Medicare exam/ wellness visit .Since last visit. Infection in tooth   and had loss  has to go back ..   123/68  at  home but taking bp med without problem . No new sxx  Health Maintenance  Topic Date Due   Zoster Vaccines- Shingrix (1 of 2) Never done   DEXA SCAN  Never done   TETANUS/TDAP  11/03/2013   COVID-19 Vaccine (3 - Pfizer risk series) 03/06/2020   Pneumonia Vaccine 63+ Years old  Completed   INFLUENZA VACCINE  Completed   HPV VACCINES  Aged Out   Health Maintenance Review LIFESTYLE:  Exercise:  hamstring.  In house . Some walking.   Tobacco/ETS: n Alcohol:  n Sugar beverages: grape juice  Sleep: 6 hours  Drug use: no HH: 2 no pets   Hearing:  ok  Vision:  No limitations at present . Last eye check UTD stable  ok  r eye.  Feels like trash in  in it  Safety:  Has smoke detector and wears seat belts.   No excess sun exposure. Sees dentist regularly. Falls:  no Memory: Felt to be good  , no concern from her or her family. Depression: No anhedonia unusual crying or depressive symptoms Nutrition: Eats well balanced diet; adequate calcium and vitamin D. No swallowing chewing problems. Injury: no major injuries in the last six months. Other healthcare providers:  Reviewed today . Preventive parameters: up-to-date  Reviewed  ADLS:   There are no problems or need for assistance  driving, feeding, obtaining food, dressing, toileting and bathing, managing money using phone. She is independent.    ROS:  REST of 12 system review negative except as per HPI   Past Medical History:  Diagnosis Date   Cardiomyopathy    Glaucoma    dev vision left eye   Hyperlipidemia    Hypertension    lvh mild 2011   Left bundle branch block    Varicella    Vitamin D deficiency     Family History  Problem Relation Age of Onset   Heart disease Father    Coronary  artery disease Other    Hypertension Other    Kidney disease Other     Social History   Socioeconomic History   Marital status: Married    Spouse name: Not on file   Number of children: Not on file   Years of education: Not on file   Highest education level: Not on file  Occupational History   Not on file  Tobacco Use   Smoking status: Never   Smokeless tobacco: Never  Substance and Sexual Activity   Alcohol use: No   Drug use: No   Sexual activity: Not on file  Other Topics Concern   Not on file  Social History Narrative   Retired   [employer] of 2   Widowed 2019   Regular exercise- no   Grandchild with special needs      G3P3   Social Determinants of Radio broadcast assistant Strain: Low Risk    Difficulty of Paying Living Expenses: Not hard at all  Food Insecurity: No Food Insecurity   Worried About Charity fundraiser in the Last Year: Never true   Arboriculturist in the Last Year: Never true  Transportation Needs: No Transportation Needs  Lack of Transportation (Medical): No   Lack of Transportation (Non-Medical): No  Physical Activity: Insufficiently Active   Days of Exercise per Week: 2 days   Minutes of Exercise per Session: 20 min  Stress: No Stress Concern Present   Feeling of Stress : Not at all  Social Connections: Socially Isolated   Frequency of Communication with Friends and Family: Three times a week   Frequency of Social Gatherings with Friends and Family: Three times a week   Attends Religious Services: Never   Active Member of Clubs or Organizations: No   Attends Archivist Meetings: Never   Marital Status: Widowed    Outpatient Encounter Medications as of 09/04/2021  Medication Sig   latanoprost (XALATAN) 0.005 % ophthalmic solution Place 1 drop into both eyes at bedtime as needed.   Polyethyl Glycol-Propyl Glycol 0.4-0.3 % SOLN Apply to eye. Use as directed    telmisartan-hydrochlorothiazide (MICARDIS HCT) 40-12.5 MG tablet TAKE 1  TABLET BY MOUTH  DAILY   No facility-administered encounter medications on file as of 09/04/2021.    EXAM:  BP (!) 166/80 (BP Location: Left Arm, Patient Position: Sitting, Cuff Size: Normal)   Pulse 69   Temp 98 F (36.7 C) (Oral)   Ht 5\' 1"  (1.549 m)   Wt 130 lb 6.4 oz (59.1 kg)   SpO2 97%   BMI 24.64 kg/m   Body mass index is 24.64 kg/m.  Physical Exam: Vital signs reviewed ZOX:WRUE is a well-developed well-nourished alert cooperative   who appears stated age in no acute distress.  HEENT: normocephalic atraumatic , Eyes: PERRL EOM's full, conjunctiva clear,  , Ears: no deformity EAC's clear TMs with normal landmarks. Mouth:masked  NECK: supple without masses, thyromegaly or bruits. CHEST/PULM:  Clear to auscultation and percussion breath sounds equal no wheeze , rales or rhonchi. No chest wall deformities or tenderness.Breast: normal by inspection . No dimpling, discharge, masses, tenderness or discharge . CV: PMI is nondisplaced, S1 S2 no gallops, murmurs, rubs. Peripheral pulses are full without delay.No JVD .  ABDOMEN: Bowel sounds normal nontender  No guard or rebound, no hepato splenomegal no CVA tenderness.   Extremtities:  No clubbing cyanosis or edema, no acute joint swelling or redness no focal atrophy  som oa changes   NEURO:  Oriented x3, cranial nerves 3-12 appear to be intact, no obvious focal weakness,gait within normal limits no abnormal reflexes or asymmetrical SKIN: No acute rashes normal turgor, color, no bruising or petechiae. PSYCH: Oriented, good eye contact, no obvious depression anxiety, cognition and judgment appear normal. LN: no cervical axillary inguinal adenopathy felt  No noted deficits in memory, attention, and speech.   Lab Results  Component Value Date   WBC 9.9 09/04/2021   HGB 11.7 (L) 09/04/2021   HCT 35.5 (L) 09/04/2021   PLT 237.0 09/04/2021   GLUCOSE 80 09/04/2021   CHOL 256 (H) 09/04/2021   TRIG 52.0 09/04/2021   HDL 82.60  09/04/2021   LDLDIRECT 116.3 07/29/2011   LDLCALC 163 (H) 09/04/2021   ALT 14 09/04/2021   AST 22 09/04/2021   NA 139 09/04/2021   K 3.8 09/04/2021   CL 102 09/04/2021   CREATININE 1.07 09/04/2021   BUN 21 09/04/2021   CO2 31 09/04/2021   TSH 4.03 09/04/2021   Ate beets and peppermint  this am  ASSESSMENT AND PLAN:  Discussed the following assessment and plan:  Visit for preventive health examination  Medication management - Plan: Basic metabolic panel, CBC with  Differential/Platelet, Hepatic function panel, Lipid panel, TSH, IBC Panel([facility]), Vitamin W62, Basic metabolic panel, CBC with Differential/Platelet, Hepatic function panel, Lipid panel, TSH, IBC Panel([facility]), Vitamin B12  White coat syndrome with hypertension - Plan: Basic metabolic panel, CBC with Differential/Platelet, Hepatic function panel, Lipid panel, TSH, IBC Panel([facility]), Vitamin M35, Basic metabolic panel, CBC with Differential/Platelet, Hepatic function panel, Lipid panel, TSH, IBC Panel([facility]), Vitamin B12  Mild anemia - Plan: Basic metabolic panel, CBC with Differential/Platelet, Hepatic function panel, Lipid panel, TSH, IBC Panel([facility]), Vitamin D97, Basic metabolic panel, CBC with Differential/Platelet, Hepatic function panel, Lipid panel, TSH, IBC Panel([facility]), Vitamin B12  Stenosis of left carotid artery - Plan: Basic metabolic panel, CBC with Differential/Platelet, Hepatic function panel, Lipid panel, TSH, IBC Panel([facility]), Vitamin C16, Basic metabolic panel, CBC with Differential/Platelet, Hepatic function panel, Lipid panel, TSH, IBC Panel([facility]), Vitamin B12  Statin intolerance - Plan: Basic metabolic panel, CBC with Differential/Platelet, Hepatic function panel, Lipid panel, TSH, IBC Panel([facility]), Vitamin L84, Basic metabolic panel, CBC with Differential/Platelet, Hepatic function panel, Lipid panel, TSH, IBC Panel([facility]), Vitamin B12  Estrogen deficiency - Plan: DG Bone Density  Need  for immunization against influenza - Plan: Flu Vaccine QUAD High Dose(Fluad)  Glaucoma of both eyes, unspecified glaucoma type Go back to low dose asa baby asa 3 d per week if no bleeding  issues  Was unable to take statin in past  with se  .  Says bp is controlled at home  Flu vaccine today  Has fu dental  FU  Patient Care Team: Lillyona Polasek, Standley Brooking, MD as PCP - General Stanford Breed, Denice Bors, MD (Cardiology) Verneita Griffes, MD (Ophthalmology)  Patient Instructions  Good to see you today . Keep iup with BP readings  once a month  goal below 140/90  130/80 is great.   Same medicaiton Will notify labs results when available.  Flu vaccine today   Can get a shingles vaccine  when convenient at your pharmacy.   Consider dexa or bone density to check for osteoporosis risk of fracture    I can order this at [facility]   you call for appt  when convenient .    Bone Health Bones protect organs, store calcium, anchor muscles, and support the whole body. Keeping your bones strong is important, especially as you get older. You can take actions to help keep your bones strong and healthy. Why is keeping my bones healthy important? Keeping your bones healthy is important because your body constantly replaces bone cells. Cells get old, and new cells take their place. As we age, we lose bone cells because the body may not be able to make enough new cells to replace the old cells. The amount of bone cells and bone tissue you have is referred to as bone mass. The higher your bone mass, the stronger your bones. The aging process leads to an overall loss of bone mass in the body, which can increase the likelihood of: Joint pain and stiffness. Broken bones. A condition in which the bones become weak and brittle (osteoporosis). A large decline in bone mass occurs in older adults. In women, it occurs about the time of menopause. What actions can I take to keep my bones healthy? Good health habits  are important for maintaining healthy bones. This includes eating nutritious foods and exercising regularly. To have healthy bones, you need to get enough of the right minerals and vitamins. Most nutrition experts recommend getting these nutrients from the foods that you eat. In  some cases, taking supplements may also be recommended. Doing certain types of exercise is also important for bone health. What are the nutritional recommendations for healthy bones? Eating a well-balanced diet with plenty of calcium and vitamin D will help to protect your bones. Nutritional recommendations vary from person to person. Ask your health care provider what is healthy for you. Here are some general guidelines. Get enough calcium Calcium is the most important (essential) mineral for bone health. Most people can get enough calcium from their diet, but supplements may be recommended for people who are at risk for osteoporosis. Good sources of calcium include: Dairy products, such as low-fat or nonfat milk, cheese, and yogurt. Dark green leafy vegetables, such as bok choy and broccoli. Calcium-fortified foods, such as orange juice, cereal, bread, soy beverages, and tofu products. Nuts, such as almonds. Follow these recommended amounts for daily calcium intake: Children, age 50-3: 700 mg. Children, age 68-8: 1,000 mg. Children, age 25-13: 1,300 mg. Teens, age 29-18: 1,300 mg. Adults, age 69-50: 1,000 mg. Adults, age 682-70: Men: 1,000 mg. Women: 1,200 mg. Adults, age 82 or older: 1,200 mg. Pregnant and breastfeeding females: Teens: 1,300 mg. Adults: 1,000 mg. Get enough vitamin D Vitamin D is the most essential vitamin for bone health. It helps the body absorb calcium. Sunlight stimulates the skin to make vitamin D, so be sure to get enough sunlight. If you live in a cold climate or you do not get outside often, your health care provider may recommend that you take vitamin D supplements. Good sources of vitamin D in  your diet include: Egg yolks. Saltwater fish. Milk and cereal fortified with vitamin D. Follow these recommended amounts for daily vitamin D intake: Children and teens, age 50-18: 600 international units. Adults, age 62 or younger: 400-800 international units. Adults, age 55 or older: 800-1,000 international units. Get other important nutrients Other nutrients that are important for bone health include: Phosphorus. This mineral is found in meat, poultry, dairy foods, nuts, and legumes. The recommended daily intake for adult men and adult women is 700 mg. Magnesium. This mineral is found in seeds, nuts, dark green vegetables, and legumes. The recommended daily intake for adult men is 400-420 mg. For adult women, it is 310-320 mg. Vitamin K. This vitamin is found in green leafy vegetables. The recommended daily intake is 120 mg for adult men and 90 mg for adult women. What type of physical activity is best for building and maintaining healthy bones? Weight-bearing and strength-building activities are important for building and maintaining healthy bones. Weight-bearing activities cause muscles and bones to work against gravity. Strength-building activities increase the strength of the muscles that support bones. Weight-bearing and muscle-building activities include: Walking and hiking. Jogging and running. Dancing. Gym exercises. Lifting weights. Tennis and racquetball. Climbing stairs. Aerobics. Adults should get at least 30 minutes of moderate physical activity on most days. Children should get at least 60 minutes of moderate physical activity on most days. Ask your health care provider what type of exercise is best for you. How can I find out if my bone mass is low? Bone mass can be measured with an X-ray test called a bone mineral density (BMD) test. This test is recommended for all women who are age 73 or older. It may also be recommended for: Men who are age 57 or older. People who are  at risk for osteoporosis because of: Having bones that break easily. Having a long-term disease that weakens bones, such as kidney  disease or rheumatoid arthritis. Having menopause earlier than normal. Taking medicine that weakens bones, such as steroids, thyroid hormones, or hormone treatment for breast cancer or prostate cancer. Smoking. Drinking three or more alcoholic drinks a day. If you find that you have a low bone mass, you may be able to prevent osteoporosis or further bone loss by changing your diet and lifestyle. Where can I find more information? For more information, check out the following websites: [place]: [employer] of Health: www.bones.SouthExposed.es International Osteoporosis Foundation: Administrator.iofbonehealth.org Summary The aging process leads to an overall loss of bone mass in the body, which can increase the likelihood of broken bones and osteoporosis. Eating a well-balanced diet with plenty of calcium and vitamin D will help to protect your bones. Weight-bearing and strength-building activities are also important for building and maintaining strong bones. Bone mass can be measured with an X-ray test called a bone mineral density (BMD) test. This information is not intended to replace advice given to you by your health care provider. Make sure you discuss any questions you have with your health care provider. Document Revised: 11/16/2017 Document Reviewed: 11/16/2017 Elsevier Patient Education  2022 [place]. Dustyn Armbrister M.D.

## 2021-09-05 ENCOUNTER — Other Ambulatory Visit: Payer: Self-pay | Admitting: Internal Medicine

## 2021-09-05 LAB — PATHOLOGIST SMEAR REVIEW

## 2021-09-08 NOTE — Progress Notes (Signed)
Blood count still mild anemia  and some increase in one type of WBC  b12 and iron is ok  cholesterol about the same .   I advise consult referral to hematology to help [place] evaluate the  blood count abnormalities . To see if  clinically important    Please do referral to hematology  dx  anemia and relative lymphocytosis .

## 2021-09-09 NOTE — Addendum Note (Signed)
Addended by: Nilda Riggs on: 09/09/2021 08:09 AM   Modules accepted: Orders

## 2022-03-10 ENCOUNTER — Ambulatory Visit: Payer: Medicare Other | Admitting: Internal Medicine

## 2023-11-05 ENCOUNTER — Telehealth: Payer: Self-pay | Admitting: Internal Medicine

## 2023-11-05 NOTE — Telephone Encounter (Signed)
 Called pt to sch annual physical, left vm. Pls schedule pt for physical.
